# Patient Record
Sex: Female | Born: 1970 | Race: White | Hispanic: No | Marital: Single | Smoking: Current every day smoker
Health system: Southern US, Community
[De-identification: ages and names within clinical notes are randomized; demographics above are authoritative.]

## PROBLEM LIST (undated history)

## (undated) DIAGNOSIS — F319 Bipolar disorder, unspecified: Secondary | ICD-10-CM

## (undated) DIAGNOSIS — F419 Anxiety disorder, unspecified: Secondary | ICD-10-CM

---

## 2014-11-27 ENCOUNTER — Emergency Department: Payer: Self-pay | Admitting: Emergency Medicine

## 2014-12-15 ENCOUNTER — Emergency Department: Payer: Self-pay | Admitting: Emergency Medicine

## 2014-12-15 LAB — COMPREHENSIVE METABOLIC PANEL
ALBUMIN: 3.3 g/dL — AB (ref 3.4–5.0)
ALT: 16 U/L
AST: 34 U/L (ref 15–37)
Alkaline Phosphatase: 47 U/L
Anion Gap: 15 (ref 7–16)
BUN: 6 mg/dL — ABNORMAL LOW (ref 7–18)
Bilirubin,Total: 0.4 mg/dL (ref 0.2–1.0)
CHLORIDE: 105 mmol/L (ref 98–107)
Calcium, Total: 7.1 mg/dL — ABNORMAL LOW (ref 8.5–10.1)
Co2: 17 mmol/L — ABNORMAL LOW (ref 21–32)
Creatinine: 0.43 mg/dL — ABNORMAL LOW (ref 0.60–1.30)
EGFR (Non-African Amer.): >60
Glucose: 311 mg/dL — ABNORMAL HIGH (ref 65–99)
Osmolality: 283 (ref 275–301)
Potassium: 4.3 mmol/L (ref 3.5–5.1)
Sodium: 137 mmol/L (ref 136–145)
Total Protein: 6.5 g/dL (ref 6.4–8.2)

## 2014-12-15 LAB — CBC
HCT: 43 % (ref 35.0–47.0)
HGB: 14.1 g/dL (ref 12.0–16.0)
MCH: 31.6 pg (ref 26.0–34.0)
MCHC: 32.8 g/dL (ref 32.0–36.0)
MCV: 96 fL (ref 80–100)
Platelet: 236 10*3/uL (ref 150–440)
RBC: 4.46 10*6/uL (ref 3.80–5.20)
RDW: 15 % — ABNORMAL HIGH (ref 11.5–14.5)
WBC: 6.2 10*3/uL (ref 3.6–11.0)

## 2014-12-15 LAB — ETHANOL: ETHANOL LVL: 356 mg/dL — AB

## 2014-12-15 LAB — URINALYSIS, COMPLETE
Bacteria: NONE SEEN
Bilirubin,UR: NEGATIVE
GLUCOSE, UR: NEGATIVE mg/dL (ref 0–75)
LEUKOCYTE ESTERASE: NEGATIVE
Nitrite: NEGATIVE
PH: 7 (ref 4.5–8.0)
Protein: NEGATIVE
RBC,UR: 1 /HPF (ref 0–5)
SQUAMOUS EPITHELIAL: NONE SEEN
Specific Gravity: 1.006 (ref 1.003–1.030)
WBC UR: <1 /HPF (ref 0–5)

## 2014-12-15 LAB — DRUG SCREEN, URINE
AMPHETAMINES, UR SCREEN: NEGATIVE (ref ?–1000)
BENZODIAZEPINE, UR SCRN: POSITIVE (ref ?–200)
Barbiturates, Ur Screen: NEGATIVE (ref ?–200)
CANNABINOID 50 NG, UR ~~LOC~~: NEGATIVE (ref ?–50)
Cocaine Metabolite,Ur ~~LOC~~: NEGATIVE (ref ?–300)
MDMA (ECSTASY) UR SCREEN: NEGATIVE (ref ?–500)
Methadone, Ur Screen: NEGATIVE (ref ?–300)
Opiate, Ur Screen: NEGATIVE (ref ?–300)
Phencyclidine (PCP) Ur S: NEGATIVE (ref ?–25)
Tricyclic, Ur Screen: NEGATIVE (ref ?–1000)

## 2014-12-15 LAB — PREGNANCY, URINE: PREGNANCY TEST, URINE: NEGATIVE m[IU]/mL

## 2014-12-16 ENCOUNTER — Inpatient Hospital Stay: Payer: Self-pay | Admitting: Psychiatry

## 2014-12-16 LAB — ETHANOL

## 2014-12-19 LAB — FOLATE: Folic Acid: 75.9 ng/mL — ABNORMAL HIGH (ref 3.1–17.5)

## 2014-12-20 LAB — VALPROIC ACID LEVEL: Valproic Acid: 82 ug/mL

## 2015-01-06 ENCOUNTER — Emergency Department: Payer: Self-pay | Admitting: Student

## 2015-01-06 ENCOUNTER — Emergency Department: Payer: Self-pay | Admitting: Emergency Medicine

## 2015-01-06 LAB — URINALYSIS, COMPLETE
Bacteria: NONE SEEN
Bilirubin,UR: NEGATIVE
Glucose,UR: NEGATIVE mg/dL (ref 0–75)
KETONE: NEGATIVE
Leukocyte Esterase: NEGATIVE
Nitrite: NEGATIVE
PH: 7 (ref 4.5–8.0)
Protein: NEGATIVE
Specific Gravity: 1.003 (ref 1.003–1.030)
WBC UR: <1 /HPF (ref 0–5)

## 2015-01-06 LAB — COMPREHENSIVE METABOLIC PANEL
ALBUMIN: 3.8 g/dL (ref 3.4–5.0)
ALT: 78 U/L — AB
Alkaline Phosphatase: 65 U/L
Anion Gap: 10 (ref 7–16)
BILIRUBIN TOTAL: 0.2 mg/dL (ref 0.2–1.0)
BUN: 8 mg/dL (ref 7–18)
CHLORIDE: 99 mmol/L (ref 98–107)
CO2: 23 mmol/L (ref 21–32)
CREATININE: 0.63 mg/dL (ref 0.60–1.30)
Calcium, Total: 8.8 mg/dL (ref 8.5–10.1)
EGFR (Non-African Amer.): >60
Glucose: 80 mg/dL (ref 65–99)
Osmolality: 262 (ref 275–301)
POTASSIUM: 4.1 mmol/L (ref 3.5–5.1)
SGOT(AST): 68 U/L — ABNORMAL HIGH (ref 15–37)
SODIUM: 132 mmol/L — AB (ref 136–145)
TOTAL PROTEIN: 6.6 g/dL (ref 6.4–8.2)

## 2015-01-06 LAB — CBC
HCT: 37.9 % (ref 35.0–47.0)
HGB: 12.7 g/dL (ref 12.0–16.0)
MCH: 31.7 pg (ref 26.0–34.0)
MCHC: 33.6 g/dL (ref 32.0–36.0)
MCV: 94 fL (ref 80–100)
Platelet: 242 10*3/uL (ref 150–440)
RBC: 4.02 10*6/uL (ref 3.80–5.20)
RDW: 15.3 % — ABNORMAL HIGH (ref 11.5–14.5)
WBC: 6.9 10*3/uL (ref 3.6–11.0)

## 2015-01-06 LAB — DRUG SCREEN, URINE
AMPHETAMINES, UR SCREEN: NEGATIVE (ref ?–1000)
BENZODIAZEPINE, UR SCRN: NEGATIVE (ref ?–200)
Barbiturates, Ur Screen: POSITIVE (ref ?–200)
Cannabinoid 50 Ng, Ur ~~LOC~~: NEGATIVE (ref ?–50)
Cocaine Metabolite,Ur ~~LOC~~: NEGATIVE (ref ?–300)
MDMA (ECSTASY) UR SCREEN: NEGATIVE (ref ?–500)
Methadone, Ur Screen: NEGATIVE (ref ?–300)
Opiate, Ur Screen: NEGATIVE (ref ?–300)
PHENCYCLIDINE (PCP) UR S: NEGATIVE (ref ?–25)
Tricyclic, Ur Screen: NEGATIVE (ref ?–1000)

## 2015-01-06 LAB — SALICYLATE LEVEL: SALICYLATES, SERUM: 2.2 mg/dL

## 2015-01-06 LAB — ETHANOL
Ethanol: 35 mg/dL
Ethanol: 257 mg/dL

## 2015-01-06 LAB — ACETAMINOPHEN LEVEL: ACETAMINOPHEN: 3 ug/mL — AB

## 2015-01-07 ENCOUNTER — Encounter (HOSPITAL_COMMUNITY): Payer: Self-pay | Admitting: Emergency Medicine

## 2015-01-07 ENCOUNTER — Ambulatory Visit (HOSPITAL_COMMUNITY)
Admission: AD | Admit: 2015-01-07 | Discharge: 2015-01-07 | Disposition: A | Discharge status: Home / Self Care | Payer: 59 | Source: Intra-hospital | Attending: Psychiatry | Admitting: Psychiatry

## 2015-01-07 ENCOUNTER — Encounter (HOSPITAL_COMMUNITY): Payer: Self-pay | Admitting: *Deleted

## 2015-01-07 ENCOUNTER — Emergency Department (HOSPITAL_COMMUNITY)
Admission: EM | Admit: 2015-01-07 | Discharge: 2015-01-07 | Disposition: A | Discharge status: Other Acute Inpatient Hospital | Payer: 59 | Attending: Emergency Medicine | Admitting: Emergency Medicine

## 2015-01-07 ENCOUNTER — Inpatient Hospital Stay (HOSPITAL_COMMUNITY)
Admission: EM | Admit: 2015-01-07 | Discharge: 2015-01-12 | DRG: 897 | Disposition: A | Discharge status: Home / Self Care | Payer: 59 | Source: Intra-hospital | Attending: Psychiatry | Admitting: Psychiatry

## 2015-01-07 DIAGNOSIS — F1010 Alcohol abuse, uncomplicated: Secondary | ICD-10-CM | POA: Diagnosis not present

## 2015-01-07 DIAGNOSIS — F1310 Sedative, hypnotic or anxiolytic abuse, uncomplicated: Secondary | ICD-10-CM | POA: Diagnosis not present

## 2015-01-07 DIAGNOSIS — F319 Bipolar disorder, unspecified: Secondary | ICD-10-CM | POA: Insufficient documentation

## 2015-01-07 DIAGNOSIS — F410 Panic disorder [episodic paroxysmal anxiety] without agoraphobia: Secondary | ICD-10-CM | POA: Diagnosis present

## 2015-01-07 DIAGNOSIS — G4700 Insomnia, unspecified: Secondary | ICD-10-CM | POA: Diagnosis present

## 2015-01-07 DIAGNOSIS — Z720 Tobacco use: Secondary | ICD-10-CM | POA: Diagnosis not present

## 2015-01-07 DIAGNOSIS — F411 Generalized anxiety disorder: Secondary | ICD-10-CM | POA: Diagnosis present

## 2015-01-07 DIAGNOSIS — Z79899 Other long term (current) drug therapy: Secondary | ICD-10-CM | POA: Insufficient documentation

## 2015-01-07 DIAGNOSIS — F3130 Bipolar disorder, current episode depressed, mild or moderate severity, unspecified: Secondary | ICD-10-CM | POA: Diagnosis present

## 2015-01-07 DIAGNOSIS — Z23 Encounter for immunization: Secondary | ICD-10-CM | POA: Diagnosis not present

## 2015-01-07 DIAGNOSIS — F17210 Nicotine dependence, cigarettes, uncomplicated: Secondary | ICD-10-CM | POA: Diagnosis present

## 2015-01-07 DIAGNOSIS — Y908 Blood alcohol level of 240 mg/100 ml or more: Secondary | ICD-10-CM | POA: Diagnosis present

## 2015-01-07 DIAGNOSIS — F1020 Alcohol dependence, uncomplicated: Secondary | ICD-10-CM | POA: Diagnosis present

## 2015-01-07 DIAGNOSIS — F419 Anxiety disorder, unspecified: Secondary | ICD-10-CM | POA: Insufficient documentation

## 2015-01-07 DIAGNOSIS — F10929 Alcohol use, unspecified with intoxication, unspecified: Secondary | ICD-10-CM | POA: Diagnosis present

## 2015-01-07 HISTORY — DX: Bipolar disorder, unspecified: F31.9

## 2015-01-07 HISTORY — DX: Anxiety disorder, unspecified: F41.9

## 2015-01-07 LAB — COMPREHENSIVE METABOLIC PANEL
ALBUMIN: 4.1 g/dL (ref 3.5–5.2)
ALK PHOS: 61 U/L (ref 39–117)
ALT: 64 U/L — AB (ref 0–35)
AST: 55 U/L — AB (ref 0–37)
Anion gap: 13 (ref 5–15)
BILIRUBIN TOTAL: 0.3 mg/dL (ref 0.3–1.2)
BUN: 6 mg/dL (ref 6–23)
CALCIUM: 8.5 mg/dL (ref 8.4–10.5)
CO2: 24 mmol/L (ref 19–32)
Chloride: 98 mmol/L (ref 96–112)
Creatinine, Ser: 0.5 mg/dL (ref 0.50–1.10)
GFR calc Af Amer: >90 mL/min (ref >90)
GFR calc non Af Amer: >90 mL/min (ref >90)
Glucose, Bld: 154 mg/dL — ABNORMAL HIGH (ref 70–99)
Potassium: 4.1 mmol/L (ref 3.5–5.1)
SODIUM: 135 mmol/L (ref 135–145)
Total Protein: 6.8 g/dL (ref 6.0–8.3)

## 2015-01-07 LAB — CBC WITH DIFFERENTIAL/PLATELET
BASOS PCT: 1 % (ref 0–1)
Basophils Absolute: 0.1 10*3/uL (ref 0.0–0.1)
Eosinophils Absolute: 0 10*3/uL (ref 0.0–0.7)
Eosinophils Relative: 0 % (ref 0–5)
HEMATOCRIT: 40 % (ref 36.0–46.0)
HEMOGLOBIN: 13.2 g/dL (ref 12.0–15.0)
Lymphocytes Relative: 18 % (ref 12–46)
Lymphs Abs: 1.5 10*3/uL (ref 0.7–4.0)
MCH: 31.1 pg (ref 26.0–34.0)
MCHC: 33 g/dL (ref 30.0–36.0)
MCV: 94.3 fL (ref 78.0–100.0)
MONO ABS: 0.8 10*3/uL (ref 0.1–1.0)
Monocytes Relative: 9 % (ref 3–12)
NEUTROS ABS: 6.3 10*3/uL (ref 1.7–7.7)
Neutrophils Relative %: 72 % (ref 43–77)
Platelets: 338 10*3/uL (ref 150–400)
RBC: 4.24 MIL/uL (ref 3.87–5.11)
RDW: 15.7 % — ABNORMAL HIGH (ref 11.5–15.5)
WBC: 8.8 10*3/uL (ref 4.0–10.5)

## 2015-01-07 LAB — RAPID URINE DRUG SCREEN, HOSP PERFORMED
Amphetamines: NOT DETECTED
BARBITURATES: POSITIVE — AB
Benzodiazepines: POSITIVE — AB
COCAINE: NOT DETECTED
OPIATES: NOT DETECTED
Tetrahydrocannabinol: NOT DETECTED

## 2015-01-07 LAB — VALPROIC ACID LEVEL: Valproic Acid Lvl: 35.2 ug/mL — ABNORMAL LOW (ref 50.0–100.0)

## 2015-01-07 LAB — ETHANOL: Alcohol, Ethyl (B): 278 mg/dL — ABNORMAL HIGH (ref 0–9)

## 2015-01-07 MED ORDER — ONDANSETRON 4 MG PO TBDP: 4.0000 mg | ORAL_TABLET | Freq: Four times a day (QID) | ORAL | Status: DC | PRN

## 2015-01-07 MED ORDER — CHLORDIAZEPOXIDE HCL 25 MG PO CAPS
25.0000 mg | ORAL_CAPSULE | Freq: Four times a day (QID) | ORAL | Status: DC | PRN
  Administered 2015-01-07 – 2015-01-09 (×5): 25 mg via ORAL
  Filled 2015-01-07 (×4): qty 1

## 2015-01-07 MED ORDER — THIAMINE HCL 100 MG/ML IJ SOLN: 100.0000 mg | Freq: Once | INTRAMUSCULAR | Status: DC

## 2015-01-07 MED ORDER — ONDANSETRON 4 MG PO TBDP: 4.0000 mg | ORAL_TABLET | Freq: Four times a day (QID) | ORAL | Status: AC | PRN

## 2015-01-07 MED ORDER — ADULT MULTIVITAMIN W/MINERALS CH
1.0000 | ORAL_TABLET | Freq: Every day | ORAL | Status: DC
  Administered 2015-01-08 – 2015-01-12 (×5): 1 via ORAL
  Filled 2015-01-07 (×7): qty 1

## 2015-01-07 MED ORDER — LOPERAMIDE HCL 2 MG PO CAPS: 2.0000 mg | ORAL_CAPSULE | ORAL | Status: AC | PRN

## 2015-01-07 MED ORDER — ADULT MULTIVITAMIN W/MINERALS CH
1.0000 | ORAL_TABLET | Freq: Every day | ORAL | Status: DC
  Administered 2015-01-07: 1 via ORAL
  Filled 2015-01-07: qty 1

## 2015-01-07 MED ORDER — HALOPERIDOL 5 MG PO TABS
5.0000 mg | ORAL_TABLET | Freq: Four times a day (QID) | ORAL | Status: DC | PRN
  Administered 2015-01-08 (×2): 5 mg via ORAL
  Filled 2015-01-07 (×2): qty 1

## 2015-01-07 MED ORDER — LORAZEPAM 1 MG PO TABS
1.0000 mg | ORAL_TABLET | Freq: Once | ORAL | Status: AC
  Administered 2015-01-07: 1 mg via ORAL
  Filled 2015-01-07: qty 1

## 2015-01-07 MED ORDER — HYDROXYZINE HCL 25 MG PO TABS
25.0000 mg | ORAL_TABLET | Freq: Four times a day (QID) | ORAL | Status: DC | PRN
  Administered 2015-01-08: 25 mg via ORAL
  Filled 2015-01-07: qty 1

## 2015-01-07 MED ORDER — CHLORDIAZEPOXIDE HCL 25 MG PO CAPS: 25.0000 mg | ORAL_CAPSULE | Freq: Four times a day (QID) | ORAL | Status: DC | PRN

## 2015-01-07 MED ORDER — ACETAMINOPHEN 325 MG PO TABS: 650.0000 mg | ORAL_TABLET | Freq: Four times a day (QID) | ORAL | Status: DC | PRN

## 2015-01-07 MED ORDER — HYDROXYZINE HCL 25 MG PO TABS: 25.0000 mg | ORAL_TABLET | Freq: Four times a day (QID) | ORAL | Status: DC | PRN

## 2015-01-07 MED ORDER — MAGNESIUM HYDROXIDE 400 MG/5ML PO SUSP: 30.0000 mL | Freq: Every day | ORAL | Status: DC | PRN

## 2015-01-07 MED ORDER — VITAMIN B-1 100 MG PO TABS
100.0000 mg | ORAL_TABLET | Freq: Every day | ORAL | Status: DC
  Administered 2015-01-07: 100 mg via ORAL
  Filled 2015-01-07: qty 1

## 2015-01-07 MED ORDER — PNEUMOCOCCAL VAC POLYVALENT 25 MCG/0.5ML IJ INJ: 0.5000 mL | INJECTION | INTRAMUSCULAR | Status: DC

## 2015-01-07 MED ORDER — LOPERAMIDE HCL 2 MG PO CAPS
2.0000 mg | ORAL_CAPSULE | ORAL | Status: DC | PRN
Start: 2015-01-07 — End: 2015-01-07

## 2015-01-07 MED ORDER — INFLUENZA VAC SPLIT QUAD 0.5 ML IM SUSY
0.5000 mL | PREFILLED_SYRINGE | INTRAMUSCULAR | Status: DC
  Filled 2015-01-07: qty 0.5

## 2015-01-07 MED ORDER — ALUM & MAG HYDROXIDE-SIMETH 200-200-20 MG/5ML PO SUSP: 30.0000 mL | ORAL | Status: DC | PRN

## 2015-01-07 MED ORDER — TRAZODONE HCL 50 MG PO TABS
50.0000 mg | ORAL_TABLET | Freq: Every evening | ORAL | Status: DC | PRN
  Administered 2015-01-07 – 2015-01-08 (×2): 50 mg via ORAL
  Filled 2015-01-07: qty 1

## 2015-01-07 NOTE — Progress Notes (Signed)
Pt admitted to [facility] Adult unit with anxiety, depression and alcohol dependence. Pt presents anxious, irritable, depressed but cooperative. 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 Explanations and directions given. Pt is AOx4, admits to not always remembering details of events and is a poor historian. Denies SI/HI at present to Probation officer and admits to earlier statement in ED about wanting to kill herself. Verbally contracts for safety. -A/[employer]. Pt reports drinking a pint or more daily for about 12 years on/off. Recent detox in rehab facility and relapsed this prior weekend. Pt admits to depression related to multiple stressors (losing job, break up with  Boyfriend, being fired from job and death of Neurosurgeon).  Emotional support and encouragement given. Will monitor closely and evaluate for stabilization.

## 2015-01-07 NOTE — ED Notes (Signed)
Per pt states increased ETOH, anxiety-was at [facility] for eval, sent here for medical clearance and can return onced cleared

## 2015-01-07 NOTE — Progress Notes (Signed)
Pt AUDIT score 30. Alcohol Education Use sheet reviewed with pt. Physician notified of AUDIT score.

## 2015-01-07 NOTE — Tx Team (Signed)
Initial Interdisciplinary Treatment Plan   PATIENT STRESSORS: Loss of job Substance abuse Traumatic event death of cat Break up with boyfriend   PATIENT STRENGTHS: Ability for insight Average or above average intelligence Capable of independent living Communication skills Physical Health Supportive family/friends   PROBLEM LIST: Problem List/Patient Goals Date to be addressed Date deferred Reason deferred Estimated date of resolution  Alcohol Dependence "to stop drinking" 01/07/15   At D/C  Anxiety  01/07/15   At D/c  Depression 01/07/15   At D/c                                       DISCHARGE CRITERIA:  Adequate post-discharge living arrangements Improved stabilization in mood, thinking, and/or behavior Motivation to continue treatment in a less acute level of care Need for constant or close observation no longer present Verbal commitment to aftercare and medication compliance Withdrawal symptoms are absent or subacute and managed without 24-hour nursing intervention  PRELIMINARY DISCHARGE PLAN: Attend PHP/IOP Outpatient therapy Return to previous living arrangement  PATIENT/FAMIILY INVOLVEMENT: This treatment plan has been presented to and reviewed with the patient, Lottie Sigman.  The patient and family have been given the opportunity to ask questions and make suggestions.  Apolinar Junes 01/07/2015, 10:21 PM
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
## 2015-01-07 NOTE — ED Provider Notes (Signed)
CSN: [id number]     Arrival date & time 01/07/15  1647 History   First MD Initiated Contact with Patient 01/07/15 1858     Chief Complaint  Patient presents with  . Anxiety     (Consider location/radiation/quality/duration/timing/severity/associated sxs/prior Treatment) HPI  Pt with hx of bipolar, alcohol abuse presents with request for medical clearance from [facility].  She was placed under IVC there and will be admitted once medically cleared here.  Pt states she had her last drink this morning.  She states she is very anxious and is requesting ativan.  Pt is not very forthcoming with her history.  There are no other associated systemic symptoms, there are no other alleviating or modifying factors.  Symptoms are constant and worsening.    Past Medical History  Diagnosis Date  . Anxiety   . Bipolar 1 disorder    History reviewed. No pertinent past surgical history. No family history on file. History  Substance Use Topics  . Smoking status: Current Every Day Smoker    Types: Cigarettes  . Smokeless tobacco: Not on file  . Alcohol Use: Yes   OB History    No data available     Review of Systems  ROS reviewed and all otherwise negative except for mentioned in HPI    Allergies  Review of patient's allergies indicates no known allergies.  Home Medications   Prior to Admission medications   Medication Sig Start Date End Date Taking? Authorizing Provider  Asenapine Maleate 10 MG SUBL Place 10 mg under the tongue daily.   Yes Historical Provider, MD  busPIRone (BUSPAR) 5 MG tablet Take 5 mg by mouth 3 (three) times daily.   Yes Historical Provider, MD  divalproex (DEPAKOTE) 500 MG DR tablet Take 500 mg by mouth 2 (two) times daily.   Yes Historical Provider, MD  gabapentin (NEURONTIN) 400 MG capsule Take 400 mg by mouth 3 (three) times daily.   Yes Historical Provider, MD  hydrOXYzine (ATARAX/VISTARIL) 25 MG tablet Take 25 mg by mouth 3 (three) times daily as needed for anxiety  (anxiety).   Yes Historical Provider, MD   BP 107/68 mmHg  Pulse 119  Temp(Src) 97.6 F (36.4 C) (Oral)  Resp 18  SpO2 98%  LMP  (LMP Unknown)  Vitals reviewed Physical Exam  Physical Examination: General appearance - alert, well appearing, and in no distress Mental status - alert, oriented to person, place, and time Eyes - no conjunctival injection, no scleral icterus Mouth - mucous membranes moist, pharynx normal without lesions Chest - clear to auscultation, no wheezes, rales or rhonchi, symmetric air entry Heart - normal rate, regular rhythm, normal S1, S2, no murmurs, rubs, clicks or gallops Abdomen - soft, nontender, nondistended, no masses or organomegaly Extremities - peripheral pulses normal, no pedal edema, no clubbing or cyanosis Skin - normal coloration and turgor, no rashes Psych- anxious, labile  ED Course  Procedures (including critical care time)  7:24 PM d/w [place], TTS, she has talked with [facility] and states they are OK with patient being transferred back to [facility] as long as the depakote level is pending- it is drawn and pending.   Labs Review Labs Reviewed  VALPROIC ACID LEVEL - Abnormal; Notable for the following:    Valproic Acid Lvl 35.2 (*)    All other components within normal limits  CBC WITH DIFFERENTIAL/PLATELET - Abnormal; Notable for the following:    RDW 15.7 (*)    All other components within normal limits  COMPREHENSIVE  METABOLIC PANEL - Abnormal; Notable for the following:    Glucose, Bld 154 (*)    AST 55 (*)    ALT 64 (*)    All other components within normal limits  URINE RAPID DRUG SCREEN (HOSP PERFORMED) - Abnormal; Notable for the following:    Benzodiazepines POSITIVE (*)    Barbiturates POSITIVE (*)    All other components within normal limits  ETHANOL - Abnormal; Notable for the following:    Alcohol, Ethyl (B) 278 (*)    All other components within normal limits    Imaging Review No results found.   EKG Interpretation None       MDM   Final diagnoses:  Alcohol abuse  Anxiety    Pt presenting for medical clearance from [facility]- she was placed under IVC there- hx of bipolar and alcohol abuse.  Pt is medically cleared, depakote level is pending- [facility] has accepted patient back there.      Threasa Beards, MD 01/08/15 Laureen Abrahams

## 2015-01-07 NOTE — ED Notes (Signed)
Pt has a bed at [facility], [address]

## 2015-01-07 NOTE — ED Notes (Signed)
Per EMS pt IVCed here from behavioral health for lab work and head x-ray after falling in kitchen, hitting head, and feeling anxious.

## 2015-01-08 ENCOUNTER — Encounter (HOSPITAL_COMMUNITY): Payer: Self-pay | Admitting: Psychiatry

## 2015-01-08 DIAGNOSIS — F3130 Bipolar disorder, current episode depressed, mild or moderate severity, unspecified: Secondary | ICD-10-CM

## 2015-01-08 DIAGNOSIS — F1020 Alcohol dependence, uncomplicated: Principal | ICD-10-CM

## 2015-01-08 LAB — LIPID PANEL
CHOL/HDL RATIO: 1.9 ratio
Cholesterol: 158 mg/dL (ref 0–200)
HDL: 85 mg/dL (ref >39)
LDL Cholesterol: 56 mg/dL (ref 0–99)
Triglycerides: 83 mg/dL (ref <150)
VLDL: 17 mg/dL (ref 0–40)

## 2015-01-08 LAB — TSH: TSH: 2.858 u[IU]/mL (ref 0.350–4.500)

## 2015-01-08 MED ORDER — ASENAPINE MALEATE 10 MG SL SUBL
10.0000 mg | SUBLINGUAL_TABLET | Freq: Every day | SUBLINGUAL | Status: DC
  Filled 2015-01-08 (×3): qty 1

## 2015-01-08 MED ORDER — ASENAPINE MALEATE 10 MG SL SUBL
10.0000 mg | SUBLINGUAL_TABLET | Freq: Every day | SUBLINGUAL | Status: DC
  Administered 2015-01-08: 10 mg via SUBLINGUAL
  Filled 2015-01-08 (×2): qty 1

## 2015-01-08 MED ORDER — DIVALPROEX SODIUM 500 MG PO DR TAB
500.0000 mg | DELAYED_RELEASE_TABLET | Freq: Two times a day (BID) | ORAL | Status: DC
  Administered 2015-01-08 – 2015-01-12 (×9): 500 mg via ORAL
  Filled 2015-01-08 (×2): qty 1
  Filled 2015-01-08: qty 8
  Filled 2015-01-08: qty 1
  Filled 2015-01-08: qty 8
  Filled 2015-01-08 (×7): qty 1
  Filled 2015-01-08: qty 8
  Filled 2015-01-08 (×2): qty 1
  Filled 2015-01-08: qty 8

## 2015-01-08 MED ORDER — CHLORDIAZEPOXIDE HCL 25 MG PO CAPS
25.0000 mg | ORAL_CAPSULE | Freq: Every evening | ORAL | Status: DC | PRN
  Administered 2015-01-08 – 2015-01-11 (×4): 25 mg via ORAL
  Filled 2015-01-08 (×5): qty 1

## 2015-01-08 MED ORDER — HYDROXYZINE HCL 25 MG PO TABS
25.0000 mg | ORAL_TABLET | Freq: Three times a day (TID) | ORAL | Status: DC | PRN
  Administered 2015-01-08 – 2015-01-11 (×3): 25 mg via ORAL
  Filled 2015-01-08 (×2): qty 1
  Filled 2015-01-08: qty 10
  Filled 2015-01-08: qty 1

## 2015-01-08 MED ORDER — DIVALPROEX SODIUM 500 MG PO DR TAB: 500.0000 mg | DELAYED_RELEASE_TABLET | Freq: Two times a day (BID) | ORAL | Status: DC

## 2015-01-08 MED ORDER — GABAPENTIN 400 MG PO CAPS
400.0000 mg | ORAL_CAPSULE | Freq: Three times a day (TID) | ORAL | Status: DC
  Administered 2015-01-08 – 2015-01-12 (×13): 400 mg via ORAL
  Filled 2015-01-08 (×19): qty 1

## 2015-01-08 MED ORDER — TRAZODONE HCL 100 MG PO TABS
100.0000 mg | ORAL_TABLET | Freq: Every evening | ORAL | Status: DC | PRN
  Administered 2015-01-08: 100 mg via ORAL
  Filled 2015-01-08: qty 1

## 2015-01-08 MED ORDER — BUSPIRONE HCL 5 MG PO TABS
5.0000 mg | ORAL_TABLET | Freq: Three times a day (TID) | ORAL | Status: DC
  Administered 2015-01-08 – 2015-01-12 (×13): 5 mg via ORAL
  Filled 2015-01-08: qty 1
  Filled 2015-01-08: qty 12
  Filled 2015-01-08 (×2): qty 1
  Filled 2015-01-08 (×2): qty 12
  Filled 2015-01-08 (×4): qty 1
  Filled 2015-01-08: qty 12
  Filled 2015-01-08: qty 1
  Filled 2015-01-08: qty 12
  Filled 2015-01-08 (×4): qty 1
  Filled 2015-01-08: qty 12
  Filled 2015-01-08 (×6): qty 1

## 2015-01-08 NOTE — BHH Suicide Risk Assessment (Signed)
[facility] Admission Suicide Risk Assessment   Nursing information obtained from:  Patient Demographic factors:  Caucasian, Unemployed Current Mental Status:  NA Loss Factors:  Decrease in vocational status, Loss of significant relationship Historical Factors:  Family history of mental illness or substance abuse Risk Reduction Factors:  Religious beliefs about death, Living with another person, especially a relative, Positive social support Total Time spent with patient: 45 minutes Principal Problem: Alcohol use disorder, severe, dependence Diagnosis:   Patient Active Problem List   Diagnosis Date Noted  . Bipolar I disorder, most recent episode depressed [F31.30] 01/08/2015  . Alcohol use disorder, severe, dependence [F10.20] 01/07/2015     Continued Clinical Symptoms:  Alcohol Use Disorder Identification Test Final Score (AUDIT): 30 The "Alcohol Use Disorders Identification Test", Guidelines for Use in Primary Care, Second Edition.  World Pharmacologist [facility]). Score between 0-7:  no or low risk or alcohol related problems. Score between 8-15:  moderate risk of alcohol related problems. Score between 16-19:  high risk of alcohol related problems. Score 20 or above:  warrants further diagnostic evaluation for alcohol dependence and treatment.   CLINICAL FACTORS:   Bipolar Disorder:   Depressive phase Alcohol/Substance Abuse/Dependencies   Musculoskeletal: Strength & Muscle Tone: within normal limits Gait & Station: normal Patient leans: N/A  Psychiatric Specialty Exam: Physical Exam  ROS  Blood pressure 99/59, pulse 103, temperature 98.1 F (36.7 C), temperature source Oral, resp. rate 18, height 5\' 8"  (1.727 m), weight 61.236 kg (135 lb).Body mass index is 20.53 kg/(m^2).  General Appearance: Disheveled  Eye Sport and exercise psychologist::  Fair  Speech:  Clear and Coherent, Pressured and interrupted, at times childish sounding  Volume:  fluctuates  Mood:  Anxious, Depressed, Dysphoric and  Irritable  Affect:  Labile and Tearful  Thought Process:  Coherent and Goal Directed  Orientation:  Full (Time, Place, and Person)  Thought Content:  symptoms events worries concerns ruminations  Suicidal Thoughts:  No  Homicidal Thoughts:  No  Memory:  Immediate;   Fair Recent;   Fair Remote;   Fair  Judgement:  Fair  Insight:  Present and Shallow  Psychomotor Activity:  Restlessness and Tremor  Concentration:  Poor  Recall:  [employer] of Knowledge:Poor  Language: Fair  Akathisia:  No  Handed:  Right  AIMS (if indicated):     Assets:  Desire for Improvement  Sleep:  Number of Hours: 4.25  Cognition: WNL  ADL's:  Intact     COGNITIVE FEATURES THAT CONTRIBUTE TO RISK:  Closed-mindedness, Polarized thinking and Thought constriction (tunnel vision)    SUICIDE RISK:   Moderate:   PLAN OF CARE: Supportive approach/coping skills/relapse prevention                               Detox as needed                               Reassess and address her Bipolar Depression     Medical Decision Making:  Review of Psycho-Social Stressors (1), Established Problem, Worsening (2), Review of Medication Regimen & Side Effects (2) and Review of New Medication or Change in Dosage (2)  I certify that inpatient services furnished can reasonably be expected to improve the patient's condition.   Solen A 01/08/2015, 5:20 PM

## 2015-01-08 NOTE — BHH Group Notes (Signed)
[place] LCSW Group Therapy  01/08/2015 2:29 PM  Type of Therapy:  Group Therapy  Participation Level:  Minimal  Participation Quality:  Attentive  Affect:  Depressed and Labile  Cognitive:  Lacking  Insight:  Limited  Engagement in Therapy:  Improving  Modes of Intervention:  Confrontation, Discussion, Education, Exploration, Problem-solving, Rapport Building, Socialization and Support  Summary of Progress/Problems: Emotion Regulation: This group focused on both positive and negative emotion identification and allowed group members to process ways to identify feelings, regulate negative emotions, and find healthy ways to manage internal/external emotions. Group members were asked to reflect on a time when their reaction to an emotion led to a negative outcome and explored how alternative responses using emotion regulation would have benefited them. Group members were also asked to discuss a time when emotion regulation was utilized when a negative emotion was experienced. Jessica [name] was attentive during group but minimally participated in group discussion. When participating, she was able to remain on topic. Jessica [name] shared that she struggles with anxiety and often gets anxious due to not being able to find work and due to boredom/lack of her sense of purpose. She shows some progress in the group setting and improving insight AEB her ability to process how her past success at sobriety (15 months) can help her achieve the goal of sobriety again. "I went to a lot of AA meetings and had a lot of support from my friends and family."   [name], Jessica Amel  01/08/2015, 2:29 PM

## 2015-01-08 NOTE — Progress Notes (Signed)
D:  Patient has been highly anxious and tearful throughout the evening.  She reports that she is having some withdrawal symptoms of slight tremors and anxiety.  She did attend AA/NA group and talked to multiple family members on the phone.  She denies any suicidal or homicidal ideation.  After being given prn Trazodone, and librium in addition to nighttime meds she was resting comfortably in her room A:  Medications administered as ordered.  Safety checks q 15 minutes.  Emotional support provided. R:  Safety maintained on unit.

## 2015-01-08 NOTE — BHH Counselor (Addendum)
Adult Comprehensive Assessment  Patient ID: Jessica [name], female   DOB: December 02, 1971, 44 y.o.   MRN: [id number]  Information Source: Information source: Patient  Current Stressors:  Physical health (include injuries & life threatening diseases): hand twitching for past three weeks.  Bereavement / Loss: none reported.   Living/Environment/Situation:  Living Arrangements: Parent Living conditions (as described by patient or guardian): Lives in house. comfortable; loving; safe How long has patient lived in current situation?: 3 months =prior to that, living in [place], [place] for 18 years.  What is atmosphere in current home: Comfortable, Loving  Family History:  Marital status: Single Does patient have children?: No  Childhood History:  By whom was/is the patient raised?: Both parents Additional childhood history information: Both parents raised me until 44; parents divorced at 42. lived with mom for 2 years, then lived with dad until 48. I have a step mom  and get along well  with both stepparents  Description of patient's relationship with caregiver when they were a child: close to both parents and stepparents Patient's description of current relationship with people who raised him/her: good relationship with mom and dad and stepparents. mom lives in [place]  Does patient have siblings?: Yes Number of Siblings: 1 Description of patient's current relationship with siblings: Occupational psychologist. He lives in [place]. we are close.  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  Currently a student?: No Learning disability?: No ("I did really well in  school.")  Employment/Work Situation:   Employment situation: Unemployed (I've been unemployed for three months since moving back home. 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  Has patient ever been in the [employer]?: No Has patient ever served in combat?: No  Financial Resources:   Surveyor, minerals, Support from parents / caregiver Does patient have a Programmer, applications or guardian?: No  Alcohol/Substance Abuse:   What has been your use of drugs/alcohol within the last 12 months?: Relapsed three days ago-I drank vodka and wine until I black out-mostly at night. Prior to relapse, I was in treatment at [facility] for 2 weeks and sober for 3 weeks. 15 months sobriety in past 6 years of adtive addiction. "meetings and IOP helped alot."  If attempted suicide, did drugs/alcohol play a role in this?: No (no SI past or present) Alcohol/Substance Abuse Treatment Hx: Past Tx, Inpatient If yes, describe treatment: [facility] for treatment. [facility]-detox only. [facility]det[facility]. IOP in [place]. Therapy in [place], [place]. I see a psychiatrist in [place], [place]. I go [place] meetings. I have a sponsor in [place].  Has alcohol/substance abuse ever caused legal problems?: No  Social Support System:   Patient's Community Support System: Good Describe Community Support System: My friends are mostly in [place] but I have a few friends here too.  Type of faith/religion: Darrick Meigs How does patient's faith help to cope with current illness?: Prayer; I'm a member of a [employer].   Leisure/Recreation:  Leisure and Hobbies: crotchet; Barrister's clerk.   Strengths/Needs:    Strengths: hx of sobriety-"I know what I need to do-meetings and get back up with my sponsor."  Needs: anxious; lack  of effective coping skills, lacking insight, preoccupied with d/c, blaming others for her current situation.   Discharge Plan:   Does patient have access to transportation?: Yes (Car and licsense) Will patient be returning to same living situation after discharge?: Yes (return home with dad and stepmom at d/c) Currently receiving community mental health services: Yes (From Whom) (Dr. Durel Salts in [place].) If no, would patient like referral for services when discharged?: Yes (What county?) (-I just want to return to [place] and be with my sponsor as much as I can) Does patient have financial barriers related to discharge medications?: No  Summary/Recommendations:    Pt is 44 year old female living in [place], [place] ([place]) with her father and stepmother. She presents IVC to [facility] due to passive SI, alcohol abuse, bizarre/impulise/depressed mood. Pt has prior diagnosis of bipolar disorder and demonstrates some sx of mania including: rapid/pressured speech, tangential thought process, impulsivity, difficulty concentrating, and reports racing thoughts. Pt reports that she relapsed three days ago after 3 weeks of sobriety, where she went to detox and treatment at [facility]. Pt reports that her longest period of sobriety in has been 15 months "I went to meetings and AA. That helped me a lot." Pt reports having a problem with alcohol for the past 6-7 years. No other drug use identified although pt UDS positive for barbiturates and benzodiazepines. Pt denies SI/HI/AVH and reports high anxiety. Pt demanding medication and repeatedly asked if she would be seeing the doctor during assessment. Pt reports high anxiety and is requesting to d/c. Pt plans to continue seeing her psychiatrist in [facility] and is open to CSW making follow-up appt. Pt refused inpatient treatment, SAIOP, and therapy referrals at this time. PT to return home at d/c with father and stepfather. Her long-term goal is to move back to  [place]. Pt plans to follow-up with AA sponsor and begin attending meetings daily.   Smart, Brule LCSWA 01/08/2015

## 2015-01-08 NOTE — Progress Notes (Signed)
Attended group 

## 2015-01-08 NOTE — H&P (Signed)
Psychiatric Admission Assessment Adult  Patient Identification: Jessica [name] MRN:  [id number] Date of Evaluation:  01/08/2015 Chief Complaint:  Bipolar Anxiety Substance abuse Principal Diagnosis: <principal problem not specified> Diagnosis:   Patient Active Problem List   Diagnosis Date Noted  . Alcohol use disorder, severe, dependence [F10.20] 01/07/2015   History of Present Illness:: Had a relapse three or four days ago after 3 weeks of sobriety. States she has twitching in her left finger and she drank to help. A pint of Vodka and wine, 6 glasses when she did not drink the Vodka. States she just got out from the [employer] a week ago. States she has been dealing with alcohol issues for the last 10 years. She has been drinking "alcoholicly" for 7 years. States she came to [place] three and a half months as she has family here. She was in [place] was working had a boyfriend. They broke up and she came to [facility]. 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 States she has Bipolar Disorder, as of lately more depressed. She questions if it was a good idea to come to this area Elements:  Location:  alcohol dependence with an underlying bipolar disorder. Quality:  under more stress after she left [place] has seen a relapse of her alcohol dependence and exacerbation of her depression. Severity:  severe, unable to function from day to day . Timing:  drinking every day experiencing incresed depression every day. Duration:  worst in the last 3 and 1/2 months after she moved to [facility] from [place]. Context:  alcohol dependent experienced another relapsed incresingly frustrated as things are not workin out in [place]. Also with Bipolar Disorder experiencing exacerbation of her depressed phase. Associated Signs/Symptoms: Depression Symptoms:  depressed mood, anxiety, panic attacks, insomnia, disturbed sleep, (Hypo) Manic Symptoms:  Mania, last episode two and a  half a year ago. When manic has a "bad mania"  Anxiety Symptoms:  Excessive Worry, Panic Symptoms, Psychotic Symptoms:  Denies PTSD Symptoms: Negative Total Time spent with patient: 45 minutes  Past Medical History:  Past Medical History  Diagnosis Date  . Anxiety   . Bipolar 1 disorder    History reviewed. No pertinent past surgical history. Family History: History reviewed. No pertinent family history.                                Mental Illness an uncle has depressive disorder Social History:  History  Alcohol Use  . Yes     History  Drug Use No    History   Social History  . Marital Status: Single    Spouse Name: N/A    Number of Children: N/A  . Years of Education: N/A   Social History Main Topics  . Smoking status: Current Every Day Smoker    Types: Cigarettes  . Smokeless tobacco: None  . Alcohol Use: Yes  . Drug Use: No  . Sexual Activity: None   Other Topics Concern  . None   Social History Narrative  Single, came to [place] from [place] after she broke up with BF there. She has no children. She has a BA in history. Has worked as Geologist, engineering Social History:    Pain Medications: denies Prescriptions: denies abuse Over the Counter: denies History of alcohol / drug use?: Yes Longest period of sobriety (when/how long): 33month Negative Consequences of Use: Personal relationships, Work /  School Withdrawal Symptoms: Other (Comment) (Pt denies history of w/d symptoms) Name of Substance 1: alcohol 1 - Age of First Use: 15 1 - Amount (size/oz): 1pint 1 - Frequency: daily till last month in rehal 1 - Duration: 12 years  1 - Last Use / Amount: 01/07/15                   Musculoskeletal: Strength & Muscle Tone: within normal limits Gait & Station: normal Patient leans: N/A  Psychiatric Specialty Exam: Physical Exam  Review of Systems  Constitutional: Negative.   HENT: Negative.   Eyes: Negative.   Respiratory:        Cigarettes 10 a day  Cardiovascular: Negative.   Gastrointestinal: Negative.   Genitourinary: Negative.   Musculoskeletal: Negative.   Skin: Negative.   Neurological: Negative.   Endo/Heme/Allergies: Negative.   Psychiatric/Behavioral: Positive for depression and substance abuse. The patient is nervous/anxious.     Blood pressure 99/59, pulse 103, temperature 98.1 F (36.7 C), temperature source Oral, resp. rate 18, height '5\' 8"'  (1.727 m), weight 61.236 kg (135 lb).Body mass index is 20.53 kg/(m^2).  General Appearance: Fairly Groomed  Engineer, water::  Fair  Speech:  Clear and Coherent and at times child like  Volume:  fluctuates   Mood:  Anxious, Depressed and Dysphoric  Affect:  Depressed, Labile and Tearful  Thought Process:  Coherent and Goal Directed easily derailed  Orientation:  Full (Time, Place, and Person)  Thought Content:  symptoms events worries concerns, focused on wanting to be placed back on her medications and concerns about the twitch of her finger  Suicidal Thoughts:  No  Homicidal Thoughts:  No  Memory:  Immediate;   Fair Recent;   Fair Remote;   Fair  Judgement:  Fair  Insight:  Present and Shallow  Psychomotor Activity:  Restlessness and Tremor  Concentration:  Poor  Recall:  [employer] of Knowledge:Poor  Language: Fair  Akathisia:  No  Handed:  Right  AIMS (if indicated):     Assets:  Desire for Improvement  ADL's:  Intact  Cognition: WNL  Sleep:  Number of Hours: 4.25   Risk to Self: Is patient at risk for suicide?: No What has been your use of drugs/alcohol within the last 12 months?: Relapsed three days ago-I drank vodka and wine until I black out-mostly at night. Prior to relapse, I was in treatment at [facility] for 2 weeks and sober for 3 weeks. 15 months sobriety in past 6 years of adtive addiction. "meetings and IOP helped alot."  Risk to Others:   Prior Inpatient Therapy:   In [place] ( Dual Diagnosis) X 3 twice  detox, once for the Dx of Bipolar. Went to Residential  Prior Outpatient Therapy:  Yes in [place]  Alcohol Screening: 1. How often do you have a drink containing alcohol?: 2 to 3 times a week 2. How many drinks containing alcohol do you have on a typical day when you are drinking?: 10 or more 3. How often do you have six or more drinks on one occasion?: Weekly Preliminary Score: 7 4. How often during the last year have you found that you were not able to stop drinking once you had started?: Weekly 5. How often during the last year have you failed to do what was normally expected from you becasue of drinking?: Daily or almost daily 6. How often during the last year have you needed a first drink in the  morning to get yourself going after a heavy drinking session?: Monthly 7. How often during the last year have you had a feeling of guilt of remorse after drinking?: Daily or almost daily 8. How often during the last year have you been unable to remember what happened the night before because you had been drinking?: Weekly 9. Have you or someone else been injured as a result of your drinking?: No 10. Has a relative or friend or a doctor or another health worker been concerned about your drinking or suggested you cut down?: Yes, during the last year Alcohol Use Disorder Identification Test Final Score (AUDIT): 30 Brief Intervention: MD notified of score 20 or above  Allergies:  No Known Allergies Lab Results:  Results for orders placed or performed during the hospital encounter of 01/07/15 (from the past 48 hour(s))  Valproic acid level     Status: Abnormal   Collection Time: 01/07/15  5:11 PM  Result Value Ref Range   Valproic Acid Lvl 35.2 (L) 50.0 - 100.0 ug/mL    Comment: Performed at [facility]  CBC WITH DIFFERENTIAL     Status: Abnormal   Collection Time: 01/07/15  5:13 PM  Result Value Ref Range   WBC 8.8 4.0 - 10.5 K/uL   RBC 4.24 3.87 - 5.11 MIL/uL   Hemoglobin 13.2 12.0 -  15.0 g/dL   HCT 40.0 36.0 - 46.0 %   MCV 94.3 78.0 - 100.0 fL   MCH 31.1 26.0 - 34.0 pg   MCHC 33.0 30.0 - 36.0 g/dL   RDW 15.7 (H) 11.5 - 15.5 %   Platelets 338 150 - 400 K/uL   Neutrophils Relative % 72 43 - 77 %   Neutro Abs 6.3 1.7 - 7.7 K/uL   Lymphocytes Relative 18 12 - 46 %   Lymphs Abs 1.5 0.7 - 4.0 K/uL   Monocytes Relative 9 3 - 12 %   Monocytes Absolute 0.8 0.1 - 1.0 K/uL   Eosinophils Relative 0 0 - 5 %   Eosinophils Absolute 0.0 0.0 - 0.7 K/uL   Basophils Relative 1 0 - 1 %   Basophils Absolute 0.1 0.0 - 0.1 K/uL  Comprehensive metabolic panel     Status: Abnormal   Collection Time: 01/07/15  5:13 PM  Result Value Ref Range   Sodium 135 135 - 145 mmol/L   Potassium 4.1 3.5 - 5.1 mmol/L   Chloride 98 96 - 112 mmol/L   CO2 24 19 - 32 mmol/L   Glucose, Bld 154 (H) 70 - 99 mg/dL   BUN 6 6 - 23 mg/dL   Creatinine, Ser 0.50 0.50 - 1.10 mg/dL   Calcium 8.5 8.4 - 10.5 mg/dL   Total Protein 6.8 6.0 - 8.3 g/dL   Albumin 4.1 3.5 - 5.2 g/dL   AST 55 (H) 0 - 37 U/L   ALT 64 (H) 0 - 35 U/L   Alkaline Phosphatase 61 39 - 117 U/L   Total Bilirubin 0.3 0.3 - 1.2 mg/dL   GFR calc non Af Amer >90 >90 mL/min   GFR calc Af Amer >90 >90 mL/min    Comment: (NOTE) The eGFR has been calculated using the CKD EPI equation. This calculation has not been validated in all clinical situations. eGFR's persistently <90 mL/min signify possible Chronic Kidney Disease.    Anion gap 13 5 - 15  Ethanol     Status: Abnormal   Collection Time: 01/07/15  5:13 PM  Result Value Ref  Range   Alcohol, Ethyl (B) 278 (H) 0 - 9 mg/dL    Comment:        LOWEST DETECTABLE LIMIT FOR SERUM ALCOHOL IS 11 mg/dL FOR MEDICAL PURPOSES ONLY   Drug screen panel, emergency     Status: Abnormal   Collection Time: 01/07/15  5:29 PM  Result Value Ref Range   Opiates NONE DETECTED NONE DETECTED   Cocaine NONE DETECTED NONE DETECTED   Benzodiazepines POSITIVE (A) NONE DETECTED   Amphetamines NONE DETECTED NONE  DETECTED   Tetrahydrocannabinol NONE DETECTED NONE DETECTED   Barbiturates POSITIVE (A) NONE DETECTED    Comment:        DRUG SCREEN FOR MEDICAL PURPOSES ONLY.  IF CONFIRMATION IS NEEDED FOR ANY PURPOSE, NOTIFY LAB WITHIN 5 DAYS.        LOWEST DETECTABLE LIMITS FOR URINE DRUG SCREEN Drug Class       Cutoff (ng/mL) Amphetamine      1000 Barbiturate      200 Benzodiazepine   546 Tricyclics       503 Opiates          300 Cocaine          300 THC              50    Current Medications: Current Facility-Administered Medications  Medication Dose Route Frequency Provider Last Rate Last Dose  . acetaminophen (TYLENOL) tablet 650 mg  650 mg Oral Q6H PRN Delfin Gant, NP      . alum & mag hydroxide-simeth (MAALOX/MYLANTA) 200-200-20 MG/5ML suspension 30 mL  30 mL Oral Q4H PRN Delfin Gant, NP      . chlordiazePOXIDE (LIBRIUM) capsule 25 mg  25 mg Oral Q6H PRN Laverle Hobby, PA-C   25 mg at 01/08/15 0420  . haloperidol (HALDOL) tablet 5 mg  5 mg Oral Q6H PRN Laverle Hobby, PA-C   5 mg at 01/08/15 0809  . hydrOXYzine (ATARAX/VISTARIL) tablet 25 mg  25 mg Oral Q6H PRN Laverle Hobby, PA-C   25 mg at 01/08/15 0810  . Influenza vac split quadrivalent PF (FLUARIX) injection 0.5 mL  0.5 mL Intramuscular Tomorrow-1000 Maurine Minister Simon, PA-C      . loperamide (IMODIUM) capsule 2-4 mg  2-4 mg Oral PRN Laverle Hobby, PA-C      . magnesium hydroxide (MILK OF MAGNESIA) suspension 30 mL  30 mL Oral Daily PRN Delfin Gant, NP      . multivitamin with minerals tablet 1 tablet  1 tablet Oral Daily Laverle Hobby, PA-C   1 tablet at 01/08/15 0809  . ondansetron (ZOFRAN-ODT) disintegrating tablet 4 mg  4 mg Oral Q6H PRN Laverle Hobby, PA-C      . pneumococcal 23 valent vaccine (PNU-IMMUNE) injection 0.5 mL  0.5 mL Intramuscular Tomorrow-1000 Laverle Hobby, PA-C      . traZODone (DESYREL) tablet 50 mg  50 mg Oral QHS PRN,MR X 1 Spencer E Simon, PA-C   50 mg at 01/08/15 0216   PTA  Medications: Prescriptions prior to admission  Medication Sig Dispense Refill Last Dose  . Asenapine Maleate 10 MG SUBL Place 10 mg under the tongue daily.   01/06/2015 at 2100  . busPIRone (BUSPAR) 5 MG tablet Take 5 mg by mouth 3 (three) times daily.   01/06/2015 at Unknown time  . divalproex (DEPAKOTE) 500 MG DR tablet Take 500 mg by mouth 2 (two) times daily.   01/06/2015 at 2100  .  gabapentin (NEURONTIN) 400 MG capsule Take 400 mg by mouth 3 (three) times daily.   Past Week at Unknown time  . hydrOXYzine (ATARAX/VISTARIL) 25 MG tablet Take 25 mg by mouth 3 (three) times daily as needed for anxiety (anxiety).   01/06/2015 at Unknown time    Previous Psychotropic Medications: Yes as above  Substance Abuse History in the last 12 months:  Yes.      Consequences of Substance Abuse: Withdrawal Symptoms:   Diaphoresis Tremors  Results for orders placed or performed during the hospital encounter of 01/07/15 (from the past 72 hour(s))  Valproic acid level     Status: Abnormal   Collection Time: 01/07/15  5:11 PM  Result Value Ref Range   Valproic Acid Lvl 35.2 (L) 50.0 - 100.0 ug/mL    Comment: Performed at [facility]  CBC WITH DIFFERENTIAL     Status: Abnormal   Collection Time: 01/07/15  5:13 PM  Result Value Ref Range   WBC 8.8 4.0 - 10.5 K/uL   RBC 4.24 3.87 - 5.11 MIL/uL   Hemoglobin 13.2 12.0 - 15.0 g/dL   HCT 40.0 36.0 - 46.0 %   MCV 94.3 78.0 - 100.0 fL   MCH 31.1 26.0 - 34.0 pg   MCHC 33.0 30.0 - 36.0 g/dL   RDW 15.7 (H) 11.5 - 15.5 %   Platelets 338 150 - 400 K/uL   Neutrophils Relative % 72 43 - 77 %   Neutro Abs 6.3 1.7 - 7.7 K/uL   Lymphocytes Relative 18 12 - 46 %   Lymphs Abs 1.5 0.7 - 4.0 K/uL   Monocytes Relative 9 3 - 12 %   Monocytes Absolute 0.8 0.1 - 1.0 K/uL   Eosinophils Relative 0 0 - 5 %   Eosinophils Absolute 0.0 0.0 - 0.7 K/uL   Basophils Relative 1 0 - 1 %   Basophils Absolute 0.1 0.0 - 0.1 K/uL  Comprehensive metabolic panel     Status:  Abnormal   Collection Time: 01/07/15  5:13 PM  Result Value Ref Range   Sodium 135 135 - 145 mmol/L   Potassium 4.1 3.5 - 5.1 mmol/L   Chloride 98 96 - 112 mmol/L   CO2 24 19 - 32 mmol/L   Glucose, Bld 154 (H) 70 - 99 mg/dL   BUN 6 6 - 23 mg/dL   Creatinine, Ser 0.50 0.50 - 1.10 mg/dL   Calcium 8.5 8.4 - 10.5 mg/dL   Total Protein 6.8 6.0 - 8.3 g/dL   Albumin 4.1 3.5 - 5.2 g/dL   AST 55 (H) 0 - 37 U/L   ALT 64 (H) 0 - 35 U/L   Alkaline Phosphatase 61 39 - 117 U/L   Total Bilirubin 0.3 0.3 - 1.2 mg/dL   GFR calc non Af Amer >90 >90 mL/min   GFR calc Af Amer >90 >90 mL/min    Comment: (NOTE) The eGFR has been calculated using the CKD EPI equation. This calculation has not been validated in all clinical situations. eGFR's persistently <90 mL/min signify possible Chronic Kidney Disease.    Anion gap 13 5 - 15  Ethanol     Status: Abnormal   Collection Time: 01/07/15  5:13 PM  Result Value Ref Range   Alcohol, Ethyl (B) 278 (H) 0 - 9 mg/dL    Comment:        LOWEST DETECTABLE LIMIT FOR SERUM ALCOHOL IS 11 mg/dL FOR MEDICAL PURPOSES ONLY   Drug screen panel, emergency  Status: Abnormal   Collection Time: 01/07/15  5:29 PM  Result Value Ref Range   Opiates NONE DETECTED NONE DETECTED   Cocaine NONE DETECTED NONE DETECTED   Benzodiazepines POSITIVE (A) NONE DETECTED   Amphetamines NONE DETECTED NONE DETECTED   Tetrahydrocannabinol NONE DETECTED NONE DETECTED   Barbiturates POSITIVE (A) NONE DETECTED    Comment:        DRUG SCREEN FOR MEDICAL PURPOSES ONLY.  IF CONFIRMATION IS NEEDED FOR ANY PURPOSE, NOTIFY LAB WITHIN 5 DAYS.        LOWEST DETECTABLE LIMITS FOR URINE DRUG SCREEN Drug Class       Cutoff (ng/mL) Amphetamine      1000 Barbiturate      200 Benzodiazepine   161 Tricyclics       096 Opiates          300 Cocaine          300 THC              50     Observation Level/Precautions:  15 minute checks  Laboratory:  As per the ED   Psychotherapy:   Individual/group  Medications:  Librium detox if needed, resume her medications  Consultations:    Discharge Concerns:    Estimated LOS: 3-5 days  Other:     Psychological Evaluations: No   Treatment Plan Summary: Daily contact with patient to assess and evaluate symptoms and progress in treatment and Medication management  Will identify any S/S of withdrawal and use Librium to detox Alcohol Dependence: as above and relapse prevention, identify the presence of cravings and consider using Campral Bipolar Disorder, currently depressed: resume her medications and optimize response Reassess for the need of further residential treatment " they let me go too soon as insurance would not pay" Will check TSH, Lipid Profile Medical Decision Making:  Review of Psycho-Social Stressors (1), Review or order clinical lab tests (1), Established Problem, Worsening (2), Review of Medication Regimen & Side Effects (2) and Review of New Medication or Change in Dosage (2)  I certify that inpatient services furnished can reasonably be expected to improve the patient's condition.   Donovan Gatchel A 1/27/201610:32 AM

## 2015-01-08 NOTE — Progress Notes (Signed)
Patient ID: Jessica [name], female   DOB: 10/19/1971, 44 y.o.   MRN: [id number] Received report from Zenia Resides D: client anxious, asking for medications. A: Writer introduced self to client, explained that the on call physician will be notified and after pharmacy reviews and verifies medications, they will be available. Staff will monitor q54min for safety. R: Client showered and sat in day room to wait for medications. When released Probation officer reviewed and administered.
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
## 2015-01-08 NOTE — BHH Group Notes (Signed)
[facility] LCSW Aftercare Discharge Planning Group Note   01/08/2015 9:28 AM  Participation Quality:  Disruptive but redirectable   Mood/Affect:  Anxious and Irritable  Depression Rating:  0  Anxiety Rating:  10 "I want to leave."   Thoughts of Suicide:  No Will you contract for safety?   NA  Current AVH:  No   Plan for Discharge/Comments:  Pt reporting that she need her Depakote asap (500mg ). Pt reports that she lives with her parents in [place] ([place]). Pt reports that she recently completed tx at [facility] and relapsed three days ago. Pt refusing referral for i/p treatment at this time-"I just want to finish up AA and move back to [facility] with some friends." pt reports that she sees psychiatrist Doristine Church (private practice) in [place] for med management and is not interested in therapy referral for SA IOP at this time. CSW assessing. Pt adamant that she wants to d/c asap.    Transportation Means: parents   Supports: parents   Smart, Research officer, trade union

## 2015-01-08 NOTE — Progress Notes (Signed)
D: Pt appears anxious on approach. Pt presents with rapid and tangential speech. Pt thoughts are disorganized, loose association, and racing thoughts. Pt denies having any hallucinations but appears to be responding to internal stimuli. Pt wide-eyed, paranoid and ask repetitive questions. Pt asked to be started back on meds, Seroquel, Neurontin Depakote and Saphris. Pt appears disheveled and fidgety.  A: medications administered as ordered per MD. Prn meds given.  MD aware of pt request. Verbal support given. Pt encouraged to attend groups. 15 minute checks performed for safety.  R: Pt has no insight for treatment. Pt requesting to be discharged.

## 2015-01-09 DIAGNOSIS — G4700 Insomnia, unspecified: Secondary | ICD-10-CM

## 2015-01-09 MED ORDER — MIRTAZAPINE 30 MG PO TABS
30.0000 mg | ORAL_TABLET | Freq: Every day | ORAL | Status: DC
  Administered 2015-01-09 – 2015-01-11 (×3): 30 mg via ORAL
  Filled 2015-01-09: qty 1
  Filled 2015-01-09: qty 4
  Filled 2015-01-09 (×3): qty 1
  Filled 2015-01-09: qty 4

## 2015-01-09 MED ORDER — GABAPENTIN 400 MG PO CAPS
400.0000 mg | ORAL_CAPSULE | Freq: Every day | ORAL | Status: DC
  Administered 2015-01-09 – 2015-01-11 (×3): 400 mg via ORAL
  Filled 2015-01-09 (×5): qty 1

## 2015-01-09 MED ORDER — METHOCARBAMOL 500 MG PO TABS
500.0000 mg | ORAL_TABLET | Freq: Four times a day (QID) | ORAL | Status: DC | PRN
  Administered 2015-01-09 – 2015-01-10 (×2): 500 mg via ORAL
  Filled 2015-01-09 (×2): qty 1

## 2015-01-09 NOTE — Progress Notes (Signed)
D:  Patient's self inventory sheet, patient slept good last night, no sleep medication given.  Good appetite, high energy level, good concentration.  Denied depression and hopeless, rated anxiety #3.  Continues to experience tremors.  Denied SI.  Denied physical problems.  Denied physical pain.  Goal is to focus on recovery and will think positively.  No discharge plan.  No problems anticipated with discharge. A:  Medications administered per MD orders.  Emotional support and encouragement given patient. R:  Denied SI and HI, contracts for safety.  Denied A/V hallucinations.  Safety maintained with 15 minute checks.

## 2015-01-09 NOTE — Progress Notes (Signed)
[facility] MD Progress Note  01/09/2015 3:10 PM Jessica [name]  MRN:  [id number] Subjective: Admits she is extremely anxious. She did sleep some better last night after sleeping only one hour the night before. States she was exhausted. Worried about the possibility of having Tardive Dyskinesia given the twitches in her fingers and toes ( states she does not actually see them most of the times, but feels them) " I wonder if it is in my head" She puts her fingers on ice and this seems to help. She was off the medications for several days in a row but was drinking heavily during those days. She is afraid that she wont sleep without the Saphris but understands that Saphris could cause dyskinetic movements. She would probably go back to [facility] once she gets to feel better. She had been given Neurontin for the twitches and feels it might be helping some. ( she asked to be seen several times during the day and be reassured she does not have TD) Principal Problem: Alcohol use disorder, severe, dependence Diagnosis:   Patient Active Problem List   Diagnosis Date Noted  . Bipolar I disorder, most recent episode depressed [F31.30] 01/08/2015  . Alcohol use disorder, severe, dependence [F10.20] 01/07/2015   Total Time spent with patient: 30 minutes   Past Medical History:  Past Medical History  Diagnosis Date  . Anxiety   . Bipolar 1 disorder    History reviewed. No pertinent past surgical history. Family History: History reviewed. No pertinent family history. Social History:  History  Alcohol Use  . Yes     History  Drug Use No    History   Social History  . Marital Status: Single    Spouse Name: N/A    Number of Children: N/A  . Years of Education: N/A   Social History Main Topics  . Smoking status: Current Every Day Smoker    Types: Cigarettes  . Smokeless tobacco: None  . Alcohol Use: Yes  . Drug Use: No  . Sexual Activity: None   Other Topics Concern  . None   Social History  Narrative   Additional History:    Sleep: Poor  Appetite:  Poor   Assessment:   Musculoskeletal: Strength & Muscle Tone: within normal limits Gait & Station: normal Patient leans: N/A   Psychiatric Specialty Exam: Physical Exam  ROS  Blood pressure 119/64, pulse 96, temperature 97.8 F (36.6 C), temperature source Oral, resp. rate 18, height 5\' 8"  (1.727 m), weight 61.236 kg (135 lb).Body mass index is 20.53 kg/(m^2).  General Appearance: Fairly Groomed  Engineer, water::  Fair  Speech:  Clear and Coherent  Volume:  Decreased  Mood:  Anxious, Depressed, Dysphoric and worried  Affect:  anxious worried sad at times to the point of tears  Thought Process:  Coherent and Goal Directed  Orientation:  Full (Time, Place, and Person)  Thought Content:  Rumination and symptoms worries concerns, somcatically focused  Suicidal Thoughts:  No  Homicidal Thoughts:  No  Memory:  Immediate;   Fair Recent;   Fair Remote;   Fair  Judgement:  Fair  Insight:  Present and Shallow  Psychomotor Activity:  Restlessness  Concentration:  Poor  Recall:  [employer] of Knowledge:Fair  Language: Fair  Akathisia:  No  Handed:  Right  AIMS (if indicated):     Assets:  Desire for Improvement Social Support  ADL's:  Intact  Cognition: WNL  Sleep:  Number of Hours: 5  Current Medications: Current Facility-Administered Medications  Medication Dose Route Frequency Provider Last Rate Last Dose  . acetaminophen (TYLENOL) tablet 650 mg  650 mg Oral Q6H PRN Delfin Gant, NP      . alum & mag hydroxide-simeth (MAALOX/MYLANTA) 200-200-20 MG/5ML suspension 30 mL  30 mL Oral Q4H PRN Delfin Gant, NP      . busPIRone (BUSPAR) tablet 5 mg  5 mg Oral TID Nicholaus Bloom, MD   5 mg at 01/09/15 1255  . chlordiazePOXIDE (LIBRIUM) capsule 25 mg  25 mg Oral Q6H PRN Laverle Hobby, PA-C   25 mg at 01/09/15 1331  . chlordiazePOXIDE (LIBRIUM) capsule 25 mg  25 mg Oral QHS PRN Nicholaus Bloom, MD   25 mg  at 01/08/15 2143  . divalproex (DEPAKOTE) DR tablet 500 mg  500 mg Oral BID Nicholaus Bloom, MD   500 mg at 01/09/15 0932  . gabapentin (NEURONTIN) capsule 400 mg  400 mg Oral TID Nicholaus Bloom, MD   400 mg at 01/09/15 1254  . gabapentin (NEURONTIN) capsule 400 mg  400 mg Oral QHS Nicholaus Bloom, MD      . hydrOXYzine (ATARAX/VISTARIL) tablet 25 mg  25 mg Oral TID PRN Nicholaus Bloom, MD   25 mg at 01/08/15 1827  . Influenza vac split quadrivalent PF (FLUARIX) injection 0.5 mL  0.5 mL Intramuscular Tomorrow-1000 Maurine Minister Simon, PA-C   0.5 mL at 01/08/15 1157  . loperamide (IMODIUM) capsule 2-4 mg  2-4 mg Oral PRN Laverle Hobby, PA-C      . magnesium hydroxide (MILK OF MAGNESIA) suspension 30 mL  30 mL Oral Daily PRN Delfin Gant, NP      . methocarbamol (ROBAXIN) tablet 500 mg  500 mg Oral Q6H PRN Nicholaus Bloom, MD      . mirtazapine (REMERON) tablet 30 mg  30 mg Oral QHS Nicholaus Bloom, MD      . multivitamin with minerals tablet 1 tablet  1 tablet Oral Daily Laverle Hobby, PA-C   1 tablet at 01/09/15 3557  . ondansetron (ZOFRAN-ODT) disintegrating tablet 4 mg  4 mg Oral Q6H PRN Laverle Hobby, PA-C      . pneumococcal 23 valent vaccine (PNU-IMMUNE) injection 0.5 mL  0.5 mL Intramuscular Tomorrow-1000 Laverle Hobby, PA-C   0.5 mL at 01/08/15 1158    Lab Results:  Results for orders placed or performed during the hospital encounter of 01/07/15 (from the past 48 hour(s))  TSH     Status: None   Collection Time: 01/08/15  7:36 PM  Result Value Ref Range   TSH 2.858 0.350 - 4.500 uIU/mL    Comment: Performed at [facility]  Lipid panel     Status: None   Collection Time: 01/08/15  7:36 PM  Result Value Ref Range   Cholesterol 158 0 - 200 mg/dL   Triglycerides 83 <150 mg/dL   HDL 85 >39 mg/dL   Total CHOL/HDL Ratio 1.9 RATIO   VLDL 17 0 - 40 mg/dL   LDL Cholesterol 56 0 - 99 mg/dL    Comment:        Total Cholesterol/HDL:CHD Risk Coronary Heart Disease Risk Table                      Men   Women  1/2 Average Risk   3.4   3.3  Average Risk       5.0  4.4  2 X Average Risk   9.6   7.1  3 X Average Risk  23.4   11.0        Use the calculated Patient Ratio above and the CHD Risk Table to determine the patient's CHD Risk.        ATP III CLASSIFICATION (LDL):  <100     mg/dL   Optimal  100-129  mg/dL   Near or Above                    Optimal  130-159  mg/dL   Borderline  160-189  mg/dL   High  >190     mg/dL   Very High Performed at [facility]     Physical Findings: AIMS: Facial and Oral Movements Muscles of Facial Expression: None, normal Lips and Perioral Area: None, normal Jaw: None, normal Tongue: None, normal,Extremity Movements Upper (arms, wrists, hands, fingers): None, normal Lower (legs, knees, ankles, toes): None, normal, Trunk Movements Neck, shoulders, hips: None, normal, Overall Severity Severity of abnormal movements (highest score from questions above): None, normal Incapacitation due to abnormal movements: None, normal Patient's awareness of abnormal movements (rate only patient's report): No Awareness, Dental Status Current problems with teeth and/or dentures?: No Does patient usually wear dentures?: No  CIWA:  CIWA-Ar Total: 9 COWS:  COWS Total Score: 7  Treatment Plan Summary: Daily contact with patient to assess and evaluate symptoms and progress in treatment and Medication management  Alcohol Dependence: continue to use Librium to manage any S/S of  withdrawal Bipolar Disorder: will continue the Depakote and add Lamictal in an attempt to let go of the Saphris as we try to determine if the Saphris is causing the twitches in her fingers, toes. She states the twitches have been going for the last 4 weeks. She has been on Saphris for years now. Before that she was on Risperdal and Depakote. Reports no changes in medications in the last 4 weeks.  Finger and toes twitches: will reassess without the Saphris Insomnia: will  add Neurontin 400 mg with Remeron 30 mg at HS, she says the Trazodone up to 300 mg have not help  Medical Decision Making:  Review of Psycho-Social Stressors (1), Review or order clinical lab tests (1), Order AIMS Test (2), Review of Medication Regimen & Side Effects (2) and Review of New Medication or Change in Dosage (2)     Konica Stankowski A 01/09/2015, 3:10 PM

## 2015-01-09 NOTE — BHH Group Notes (Signed)
[facility] LCSW Group Therapy  01/09/2015 2:31 PM  Type of Therapy:  Group Therapy  Participation Level:  Active  Participation Quality:  Attentive  Affect:  Appropriate  Cognitive:  Alert and Oriented  Insight:  Improving  Engagement in Therapy:  Improving  Modes of Intervention:  Confrontation, Discussion, Education, Exploration, Problem-solving, Rapport Building, Socialization and Support  Summary of Progress/Problems:  Finding Balance in Life. Today's group focused on defining balance in one's own words, identifying things that can knock one off balance, and exploring healthy ways to maintain balance in life. Group members were asked to provide an example of a time when they felt off balance, describe how they handled that situation,and process healthier ways to regain balance in the future. Group members were asked to share the most important tool for maintaining balance that they learned while at [facility] and how they plan to apply this method after discharge. Dura was attentive and engaged during today's processing group. She shared that "sobriety" was the number one priority for remaining balanced. "When I drink it affects my whole life-my relationship with my parents, my ability to work and function--everything." Persais stated that she is rethinking her plan to move back to [facility] and is planning to look for work in the area while continuing to attend AA and reconnect with her sponsor. Parissa shows progress in the group setting and improving insight AEB her ability to process how sobriety can help her reestablish balance in other facets of life. She also talked with another pt about job openings and demonstrated motivation to get the application process started.   Smart, Kenzly Rogoff  LCSWA  01/09/2015, 2:31 PM

## 2015-01-09 NOTE — Progress Notes (Signed)
Patient will discuss her medications with MD tomorrow.   Not sure if she will be taking lamictal.

## 2015-01-09 NOTE — Plan of Care (Signed)
Problem: Consults Goal: Suicide Risk Patient Education (See Patient Education module for education specifics)  Outcome: Completed/Met Date Met:  01/09/15 Nurse discussed suicidal information with patient.

## 2015-01-09 NOTE — Progress Notes (Signed)
Patient ID: Jessica [name], female   DOB: 1971/05/07, 44 y.o.   MRN: [id number] D: Client is visible on the unit, visiting with mom and dad. Client reports day went "well" and that she had received orders her home medications. A: Writer reviewed medications and administered as ordered, provided emotional support. Staff will monitor q28min for safety. R: client is safe on the unit, attended group.

## 2015-01-09 NOTE — Progress Notes (Signed)
Adult Psychoeducational Group Note  Date:  01/09/2015 Time:  8:42 PM  Group Topic/Focus:  Wrap-Up Group:   The focus of this group is to help patients review their daily goal of treatment and discuss progress on daily workbooks.  Participation Level:  Active  Participation Quality:  Attentive  Affect:  Appropriate  Cognitive:  Appropriate  Insight: Good  Engagement in Group:  Engaged  Modes of Intervention:  Discussion  Additional Comments:  Pt goal was to work on recovery and to accomplish a more positive attitude. Pt has accomplish her goal by maintaining a positive attitude along with coming up with a plan for recovery.   Quentin Angst 01/09/2015, 8:42 PM

## 2015-01-09 NOTE — BHH Group Notes (Signed)
The focus of this group is to educate the patient on the purpose and policies of crisis stabilization and provide a format to answer questions about their admission.  The group details unit policies and expectations of patients while admitted.  Patient attended 0900 nurse education orientation group this morning.  Patient actively participated, appropriate affect, alert, appropriate insight and engagement.  Today patient will work on 3 goals for discharge.  

## 2015-01-10 DIAGNOSIS — F419 Anxiety disorder, unspecified: Secondary | ICD-10-CM

## 2015-01-10 MED ORDER — LAMOTRIGINE 25 MG PO TABS
25.0000 mg | ORAL_TABLET | ORAL | Status: DC
  Administered 2015-01-10 – 2015-01-12 (×2): 25 mg via ORAL
  Filled 2015-01-10 (×2): qty 1
  Filled 2015-01-10: qty 2

## 2015-01-10 MED ORDER — CHLORDIAZEPOXIDE HCL 25 MG PO CAPS
25.0000 mg | ORAL_CAPSULE | Freq: Four times a day (QID) | ORAL | Status: DC | PRN
  Administered 2015-01-10 – 2015-01-11 (×2): 25 mg via ORAL
  Filled 2015-01-10 (×2): qty 1

## 2015-01-10 NOTE — BHH Group Notes (Signed)
[facility] LCSW Aftercare Discharge Planning Group Note   01/10/2015 9:34 AM  Participation Quality:  Appropriate   Mood/Affect:  Anxious and Appropriate  Depression Rating:  0  Anxiety Rating:  2-3 "I'm curious about why I didn't get my Lamictal at 8AM."   Thoughts of Suicide:  No Will you contract for safety?   NA  Current AVH:  No  Plan for Discharge/Comments:  Pt reports that she is feeling well today and is hoping for d/c by Sunday. Pt agreeable to allowing CSW to make follow-up at with her psychiatrist in [place]. Pt still refusing any other follow-up and plans to continue AA meetings at d/c. Reports good sleep today.   Transportation Means: father  Supports: Scientist, research (medical), Research officer, trade union

## 2015-01-10 NOTE — BHH Group Notes (Signed)
[facility] LCSW Group Therapy  01/10/2015 4:15 PM  Type of Therapy:  Group Therapy  Participation Level:  Active  Participation Quality:  Attentive  Affect:  Anxious and Appropriate  Cognitive:  Alert and Oriented  Insight:  Improving  Engagement in Therapy:  Engaged  Modes of Intervention:  Confrontation, Discussion, Education, Exploration, Problem-solving, Rapport Building, Socialization and Support  Summary of Progress/Problems: Feelings around Relapse. Group members discussed the meaning of relapse and shared personal stories of relapse, how it affected them and others, and how they perceived themselves during this time. Group members were encouraged to identify triggers, warning signs and coping skills used when facing the possibility of relapse. Social supports were discussed and explored in detail. Jessica [name] was attentive and engaged during today's processing group. She shared that boredom and not having stable employment are two primary triggers for her depression and subsequent alcoholism. Jessica [name] shared "this is enough. I can't keep doing this to myself." She talked about her previous periods of sobriety and identified "what worked." She continues to demonstrate improving insight and progress in the group setting.    Smart, Johnedward Brodrick LCSWA  01/10/2015, 4:15 PM

## 2015-01-10 NOTE — Progress Notes (Signed)
Nutrition Brief Note  Patient identified on the Malnutrition Screening Tool (MST) Report  Per RN, pt is eating adequately and eating snacks as well.  Wt Readings from Last 15 Encounters:  01/07/15 135 lb (61.236 kg)    Body mass index is 20.53 kg/(m^2). Patient meets criteria for normal based on current BMI.   Diet Order: Diet regular Pt is also offered choice of unit snacks mid-morning and mid-afternoon.  Pt is eating as desired.  Labs and medications reviewed.   No nutrition interventions warranted at this time. If nutrition issues arise, please consult RD.   Clayton Bibles, MS, RD, LDN Pager: [phone] After Hours Pager: [phone]

## 2015-01-10 NOTE — Progress Notes (Signed)
  [facility] Adult Case Management Discharge Plan :  Will you be returning to the same living situation after discharge:  Yes,  home with dad At discharge, do you have transportation home?: Yes,  dad coming at 1:30PM on Sun Do you have the ability to pay for your medications: Yes,  [employer] of information consent forms completed and submitted to Medical Records by CSW.  Patient to Follow up at: Follow-up Information    Follow up with Dr. Seymour Bars, MD.   Why:  Voicemail left for Dr. Buena Irish in attempt to make follow-up appt. If he does not call by Monday afternoon, please call his office to schedule hospital follow-up/medication management appt. Thank you!   Contact information:   [address] [place], [place] [address] Phone: [phone] Fax: [phone]      Follow up with Patient refused referral for outpatient therapy and SA IOP. She plans to continue with AA meetings.      Patient denies SI/HI: Yes,  during group/self report.    Safety Planning and Suicide Prevention discussed: Yes,  SPE completed with pt's father. SPI pamphlet provided to pt and she was encouraged to share information with support network, ask questions, and talk about any concerns relating to SPE.  Has patient been referred to the Quitline?: N/A patient is not a smoker  Smart, Alicia Amel 01/10/2015, 3:42 PM

## 2015-01-10 NOTE — Progress Notes (Signed)
Writer spoke with patient 1:1 and she requested to speak with Dr. Sabra Heck concerning her lamictal. She reports that her father read up on lamictal and it is contraindicated with depakote. Writer assured her that she would be able to speak to the doctor concerning this matter. Patient reports that she is interested in IOP and encouraged her to discuss this with her Education officer, museum. She is compliant with scheduled medications and request a prn librium at bedtime for twitching. She denies si/hi/a/v hallucinations. She voiced no other complaints. Safety maintained on unit with 15 min checks.
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
## 2015-01-10 NOTE — Progress Notes (Signed)
[facility] MD Progress Note  01/10/2015 4:11 PM Jessica [name]  MRN:  [id number] Subjective:  Jessica [name] has continued to experience anxiety, restlessness. She is still worried about the muscle twitching. She admits she might have experienced a decrease in them. She was able to sleep last night without the Saphris. She is agreeable to try the Lamictal added to the Depakote. She became increasingly more anxious and concerned when the Lamictal was not ordered to be taken at 8 AM this morning. She states she is still experiencing tremors that she believes are secondary to coming off the alcohol. She was planning to go back to [facility] but heard from her BF that he did not want her back. States that when she was busy working she did the best.  Principal Problem: Alcohol use disorder, severe, dependence Diagnosis:   Patient Active Problem List   Diagnosis Date Noted  . Bipolar I disorder, most recent episode depressed [F31.30] 01/08/2015  . Alcohol use disorder, severe, dependence [F10.20] 01/07/2015   Total Time spent with patient: 30 minutes   Past Medical History:  Past Medical History  Diagnosis Date  . Anxiety   . Bipolar 1 disorder    History reviewed. No pertinent past surgical history. Family History: History reviewed. No pertinent family history. Social History:  History  Alcohol Use  . Yes     History  Drug Use No    History   Social History  . Marital Status: Single    Spouse Name: N/A    Number of Children: N/A  . Years of Education: N/A   Social History Main Topics  . Smoking status: Current Every Day Smoker    Types: Cigarettes  . Smokeless tobacco: None  . Alcohol Use: Yes  . Drug Use: No  . Sexual Activity: None   Other Topics Concern  . None   Social History Narrative   Additional History:    Sleep: Fair  Appetite:  Fair   Assessment:   Musculoskeletal: Strength & Muscle Tone: within normal limits Gait & Station: normal Patient leans:  N/A   Psychiatric Specialty Exam: Physical Exam  Review of Systems  Constitutional: Negative.   HENT: Negative.   Eyes: Negative.   Respiratory: Negative.   Cardiovascular: Negative.   Gastrointestinal: Negative.   Genitourinary: Negative.   Musculoskeletal: Positive for myalgias.  Skin: Negative.   Neurological: Positive for tingling and tremors.  Endo/Heme/Allergies: Negative.   Psychiatric/Behavioral: Positive for depression and substance abuse. The patient is nervous/anxious and has insomnia.     Blood pressure 104/72, pulse 88, temperature 97.8 F (36.6 C), temperature source Oral, resp. rate 16, height 5\' 8"  (1.727 m), weight 61.236 kg (135 lb).Body mass index is 20.53 kg/(m^2).  General Appearance: Fairly Groomed  Engineer, water::  Fair  Speech:  Clear and Coherent  Volume:  fluctuates  Mood:  Anxious, Depressed, Dysphoric and worried  Affect:  Restricted and anxious worried "robotic"  Thought Process:  Coherent and Goal Directed  Orientation:  Full (Time, Place, and Person)  Thought Content:  symptoms worries concerns  Suicidal Thoughts:  No  Homicidal Thoughts:  No  Memory:  Immediate;   Fair Recent;   Fair Remote;   Fair  Judgement:  Fair  Insight:  Present and Shallow  Psychomotor Activity:  Restlessness  Concentration:  Poor  Recall:  [employer] of Knowledge:Fair  Language: Fair  Akathisia:  No  Handed:  Right  AIMS (if indicated):     Assets:  Desire for Improvement  Housing  ADL's:  Intact  Cognition: WNL  Sleep:  Number of Hours: 6.5     Current Medications: Current Facility-Administered Medications  Medication Dose Route Frequency Provider Last Rate Last Dose  . acetaminophen (TYLENOL) tablet 650 mg  650 mg Oral Q6H PRN Delfin Gant, NP      . alum & mag hydroxide-simeth (MAALOX/MYLANTA) 200-200-20 MG/5ML suspension 30 mL  30 mL Oral Q4H PRN Delfin Gant, NP      . busPIRone (BUSPAR) tablet 5 mg  5 mg Oral TID Nicholaus Bloom, MD   5 mg  at 01/10/15 1152  . chlordiazePOXIDE (LIBRIUM) capsule 25 mg  25 mg Oral QHS PRN Nicholaus Bloom, MD   25 mg at 01/09/15 2142  . chlordiazePOXIDE (LIBRIUM) capsule 25 mg  25 mg Oral Q6H PRN Nicholaus Bloom, MD   25 mg at 01/10/15 1446  . divalproex (DEPAKOTE) DR tablet 500 mg  500 mg Oral BID Nicholaus Bloom, MD   500 mg at 01/10/15 0175  . gabapentin (NEURONTIN) capsule 400 mg  400 mg Oral TID Nicholaus Bloom, MD   400 mg at 01/10/15 1153  . gabapentin (NEURONTIN) capsule 400 mg  400 mg Oral QHS Nicholaus Bloom, MD   400 mg at 01/09/15 2130  . hydrOXYzine (ATARAX/VISTARIL) tablet 25 mg  25 mg Oral TID PRN Nicholaus Bloom, MD   25 mg at 01/08/15 1827  . Influenza vac split quadrivalent PF (FLUARIX) injection 0.5 mL  0.5 mL Intramuscular Tomorrow-1000 Maurine Minister Simon, PA-C   0.5 mL at 01/08/15 1157  . lamoTRIgine (LAMICTAL) tablet 25 mg  25 mg Oral QODAY Nicholaus Bloom, MD   25 mg at 01/10/15 1153  . loperamide (IMODIUM) capsule 2-4 mg  2-4 mg Oral PRN Laverle Hobby, PA-C      . magnesium hydroxide (MILK OF MAGNESIA) suspension 30 mL  30 mL Oral Daily PRN Delfin Gant, NP      . methocarbamol (ROBAXIN) tablet 500 mg  500 mg Oral Q6H PRN Nicholaus Bloom, MD   500 mg at 01/10/15 1008  . mirtazapine (REMERON) tablet 30 mg  30 mg Oral QHS Nicholaus Bloom, MD   30 mg at 01/09/15 2130  . multivitamin with minerals tablet 1 tablet  1 tablet Oral Daily Laverle Hobby, PA-C   1 tablet at 01/10/15 1025  . ondansetron (ZOFRAN-ODT) disintegrating tablet 4 mg  4 mg Oral Q6H PRN Laverle Hobby, PA-C      . pneumococcal 23 valent vaccine (PNU-IMMUNE) injection 0.5 mL  0.5 mL Intramuscular Tomorrow-1000 Laverle Hobby, PA-C   0.5 mL at 01/08/15 1158    Lab Results:  Results for orders placed or performed during the hospital encounter of 01/07/15 (from the past 48 hour(s))  TSH     Status: None   Collection Time: 01/08/15  7:36 PM  Result Value Ref Range   TSH 2.858 0.350 - 4.500 uIU/mL    Comment: Performed at  [facility]  Lipid panel     Status: None   Collection Time: 01/08/15  7:36 PM  Result Value Ref Range   Cholesterol 158 0 - 200 mg/dL   Triglycerides 83 <150 mg/dL   HDL 85 >39 mg/dL   Total CHOL/HDL Ratio 1.9 RATIO   VLDL 17 0 - 40 mg/dL   LDL Cholesterol 56 0 - 99 mg/dL    Comment:        Total  Cholesterol/HDL:CHD Risk Coronary Heart Disease Risk Table                     Men   Women  1/2 Average Risk   3.4   3.3  Average Risk       5.0   4.4  2 X Average Risk   9.6   7.1  3 X Average Risk  23.4   11.0        Use the calculated Patient Ratio above and the CHD Risk Table to determine the patient's CHD Risk.        ATP III CLASSIFICATION (LDL):  <100     mg/dL   Optimal  100-129  mg/dL   Near or Above                    Optimal  130-159  mg/dL   Borderline  160-189  mg/dL   High  >190     mg/dL   Very High Performed at [facility]     Physical Findings: AIMS: Facial and Oral Movements Muscles of Facial Expression: None, normal Lips and Perioral Area: None, normal Jaw: None, normal Tongue: None, normal,Extremity Movements Upper (arms, wrists, hands, fingers): None, normal Lower (legs, knees, ankles, toes): None, normal, Trunk Movements Neck, shoulders, hips: None, normal, Overall Severity Severity of abnormal movements (highest score from questions above): None, normal Incapacitation due to abnormal movements: None, normal Patient's awareness of abnormal movements (rate only patient's report): No Awareness, Dental Status Current problems with teeth and/or dentures?: No Does patient usually wear dentures?: No  CIWA:  CIWA-Ar Total: 2 COWS:  COWS Total Score: 7  Treatment Plan Summary: Daily contact with patient to assess and evaluate symptoms and progress in treatment and Medication management Alcohol Dependence: address the withdrawal symptoms                                    Address the cravings, develop a relapse prevention plan Bipolar  Depression; reassess her mood stability once the Saphris had been D/C. Start trial with Lamictal 25 mg Q O Day. ( she was educated about the skin rash) Severe anxiety: use Librium to help with the anxiety ( and course tremors) on a short term basis Insomnia: continue the Remeron at 30 mg HS and the Neurontin 400 mg at HS and reasess   Medical Decision Making:  Review of Psycho-Social Stressors (1), Review of Medication Regimen & Side Effects (2) and Review of New Medication or Change in Dosage (2)     Aleeta Schmaltz A 01/10/2015, 4:11 PM

## 2015-01-10 NOTE — Progress Notes (Signed)
D: Patient denies SI/HI and A/V hallucinations; patient reports sleep is good; reports appetite is good; reports energy level  Is normal; reports ability to concentrate is good; rates depression as 0/10; rates hopelessness 0/10; rates anxiety as 2/10; patient is reporting the tremors   A: Monitored q 15 minutes; patient encouraged to attend groups; patient educated about medications; patient given medications per physician orders; patient encouraged to express feelings and/or concerns  R: Patient was very anxious about taking her Lamictal and did not understand that the medicine has not been ordered; patient has to be reassured several times; patient is cooperative on the unit; patient is fidgeting;  patient's interaction with staff and peers is minimal; patient was able to set goal to talk with staff 1:1 when having feelings of SI; patient is taking medications as prescribed and tolerating medications; patient is attending all groups

## 2015-01-10 NOTE — BHH Suicide Risk Assessment (Signed)
[place] INPATIENT:  Family/Significant Other Suicide Prevention Education  Suicide Prevention Education:  Education Completed; Deloyce Walthers (pt's father) [phone] has been identified by the patient as the family member/significant other with whom the patient will be residing, and identified as the person(s) who will aid the patient in the event of a mental health crisis (suicidal ideations/suicide attempt).  With written consent from the patient, the family member/significant other has been provided the following suicide prevention education, prior to the and/or following the discharge of the patient.  The suicide prevention education provided includes the following:  Suicide risk factors  Suicide prevention and interventions  National Suicide Hotline telephone number  [facility] assessment telephone number  [facility] Emergency Assistance [place] and/or Residential Mobile Crisis Unit telephone number  Request made of family/significant other to:  Remove weapons (e.g., guns, rifles, knives), all items previously/currently identified as safety concern.    Remove drugs/medications (over-the-counter, prescriptions, illicit drugs), all items previously/currently identified as a safety concern.  The family member/significant other verbalizes understanding of the suicide prevention education information provided.  The family member/significant other agrees to remove the items of safety concern listed above.  Smart, Rohen Kimes LCSWA 01/10/2015, 3:42 PM

## 2015-01-10 NOTE — Progress Notes (Signed)
Adult Psychoeducational Group Note  Date:  01/10/2015 Time:  2:02 PM  Group Topic/Focus:  Developing a Wellness Toolbox:   The focus of this group is to help patients develop a "wellness toolbox" with skills and strategies to promote recovery upon discharge.  Participation Level:  Active  Participation Quality:  Appropriate, Attentive and Sharing  Affect:  Depressed and Flat  Cognitive:  Alert and Appropriate  Insight: Appropriate  Engagement in Group:  Engaged  Modes of Intervention:  Activity, Clarification, Discussion, Education and Support  Additional Comments:  Gratitude Journaling Pt attended most of the group on the importance of having gratitude in one's life and created a list of 10 things she is thankful for. Pt shared that she is grateful for family support and for personal qualities she has. Pt appeared to understand the importance of giving thanks when one feels depressed or stressed out to elevate mood. Pt stated that she felt uplifted after doing this exercise and staff encouraged her to continue this practice daily. Pt appeared to und  Carolyne Littles F 01/10/2015, 2:02 PM

## 2015-01-11 MED ORDER — BENZTROPINE MESYLATE 1 MG PO TABS
1.0000 mg | ORAL_TABLET | Freq: Once | ORAL | Status: AC
  Administered 2015-01-11: 1 mg via ORAL
  Filled 2015-01-11: qty 1

## 2015-01-11 MED ORDER — BENZTROPINE MESYLATE 1 MG PO TABS
ORAL_TABLET | ORAL | Status: AC
  Filled 2015-01-11: qty 1

## 2015-01-11 NOTE — BHH Group Notes (Signed)
[place] Group Notes:  (Nursing/MHT/Case Management/Adjunct)  Date:  01/11/2015  Time:  2:58 PM  Type of Therapy:  Psychoeducational Skills  Participation Level:  Active  Participation Quality:  Appropriate  Affect:  Appropriate  Cognitive:  Appropriate  Insight:  Appropriate  Engagement in Group:  Engaged  Modes of Intervention:  Discussion  Summary of Progress/Problems: Pt did attend healthy coping skills group, pt reported that her coping skill was to work on cross word puzzles.     Benancio Deeds Shanta 01/11/2015, 2:58 PM

## 2015-01-11 NOTE — BHH Group Notes (Signed)
[place] Group Notes:  (Clinical Social Work)  01/11/2015     10:30-11AM  Summary of Progress/Problems:   The main focus of today's process group was to discuss what the patients would like to work on changing in their lives, and how they might go about it.    The patient stated she is hospitalized due to being an alcoholic and having Bipolar Disorder.  She stated she wants to "learn how not to drink."  She stated she had been sober at one point for 15 months, but has relapsed several times in the last few months since moving to [place].  She stated her previous success happened after she went through detox and IOP.  She is looking for work, and is bored and frustrated when unsuccessful, which leads to drinking.  Type of Therapy:  Group Therapy - Process   Participation Level:  Minimal  Participation Quality:  Attentive  Affect:  Depressed and Flat  Cognitive:  Appropriate  Insight:  Developing/Improving  Engagement in Therapy:  Developing/Improving  Modes of Intervention:  Education, Motivational Interviewing  Selmer Dominion, LCSW 01/11/2015, 2:41 PM

## 2015-01-11 NOTE — Progress Notes (Addendum)
Patient ID: Jessica [name], female   DOB: [birth year], 44 y.o.   MRN: [id number]   D: Pt has been very flat and depressed on the unit today, patient has been very focused on her medications. Pt reported that her dad looked up her medications and seen where you can not take Depakote and Lamictal. Pt was also worried about ticks, all concerns were given to DR. Denmark new orders were noted. Pt reported that she was ready for discharge tomorrow, because [facility] was not helping her. Pt has reported severe anxiety and has required several prns with very little relief.  Pt reported being negative SI/HI, no AH/VH noted. A: 15 min checks continued for patient safety. R: Pt safety maintained.

## 2015-01-11 NOTE — BHH Group Notes (Signed)
[place] Group Notes:  (Nursing/MHT/Case Management/Adjunct)  Date:  01/11/2015  Time:  2:44 PM  Type of Therapy:  Psychoeducational Skills  Participation Level:  Active  Participation Quality:  Appropriate  Affect:  Appropriate  Cognitive:  Appropriate  Insight:  Appropriate  Engagement in Group:  Engaged  Modes of Intervention:  Discussion  Summary of Progress/Problems: Pt did attend self inventory group, pt reported that she was negative SI/HI, no AH/VH noted. Pt rated her depression as a 0, and her helplessness/hopelessness as a 0.     Pt reported that she wanted to see the doctor because her dad looked up her medication and read where Depakote and Lamictal should not be taken together. Dr. Sabra Heck made aware.   Benancio Deeds Shanta 01/11/2015, 2:44 PM

## 2015-01-11 NOTE — Progress Notes (Signed)
[facility] MD Progress Note  01/11/2015 8:06 PM Jessica [name]  MRN:  [id number] Subjective:  Jessica [name] to worry about the twitch in her fingers as well as toes. States she thought they were getting better but now she is not sure. Admits it gets better when she is distracted on not paying attention to them. She is still sleeping well. She was concerned about taking the Depakote and the Lamictal together Principal Problem: Alcohol use disorder, severe, dependence Diagnosis:   Patient Active Problem List   Diagnosis Date Noted  . Bipolar I disorder, most recent episode depressed [F31.30] 01/08/2015  . Alcohol use disorder, severe, dependence [F10.20] 01/07/2015   Total Time spent with patient: 30 minutes   Past Medical History:  Past Medical History  Diagnosis Date  . Anxiety   . Bipolar 1 disorder    History reviewed. No pertinent past surgical history. Family History: History reviewed. No pertinent family history. Social History:  History  Alcohol Use  . Yes     History  Drug Use No    History   Social History  . Marital Status: Single    Spouse Name: N/A    Number of Children: N/A  . Years of Education: N/A   Social History Main Topics  . Smoking status: Current Every Day Smoker    Types: Cigarettes  . Smokeless tobacco: None  . Alcohol Use: Yes  . Drug Use: No  . Sexual Activity: None   Other Topics Concern  . None   Social History Narrative   Additional History:    Sleep: Fair  Appetite:  Fair   Assessment:   Musculoskeletal: Strength & Muscle Tone: within normal limits Gait & Station: normal Patient leans: N/A   Psychiatric Specialty Exam: Physical Exam  Review of Systems  Constitutional: Negative.   HENT: Negative.   Eyes: Negative.   Respiratory: Negative.   Cardiovascular: Negative.   Gastrointestinal: Negative.   Genitourinary: Negative.   Musculoskeletal: Negative.   Skin: Negative.   Neurological: Positive for tremors.   Endo/Heme/Allergies: Negative.   Psychiatric/Behavioral: Positive for substance abuse. The patient is nervous/anxious.     Blood pressure 98/55, pulse 81, temperature 97.9 F (36.6 C), temperature source Oral, resp. rate 16, height 5\' 8"  (1.727 m), weight 61.236 kg (135 lb).Body mass index is 20.53 kg/(m^2).  General Appearance: Fairly Groomed  Engineer, water::  Fair  Speech:  Clear and Coherent and hesitant  Volume:  Decreased  Mood:  Anxious and worried  Affect:  anxious worried  Thought Process:  Coherent and Goal Directed  Orientation:  Full (Time, Place, and Person)  Thought Content:  worries, concerns, hyperfocused on the twitching of her fingers  Suicidal Thoughts:  No  Homicidal Thoughts:  No  Memory:  Immediate;   Fair Recent;   Fair Remote;   Fair  Judgement:  Fair  Insight:  Present and Shallow  Psychomotor Activity:  Restlessness  Concentration:  Fair  Recall:  [employer] of Knowledge:Fair  Language: Fair  Akathisia:  No  Handed:  Right  AIMS (if indicated):     Assets:  Desire for Improvement Housing Social Support  ADL's:  Intact  Cognition: WNL  Sleep:  Number of Hours: 6.75     Current Medications: Current Facility-Administered Medications  Medication Dose Route Frequency Provider Last Rate Last Dose  . acetaminophen (TYLENOL) tablet 650 mg  650 mg Oral Q6H PRN Delfin Gant, NP      . alum & mag hydroxide-simeth (MAALOX/MYLANTA)  200-200-20 MG/5ML suspension 30 mL  30 mL Oral Q4H PRN Delfin Gant, NP      . busPIRone (BUSPAR) tablet 5 mg  5 mg Oral TID Nicholaus Bloom, MD   5 mg at 01/11/15 1729  . chlordiazePOXIDE (LIBRIUM) capsule 25 mg  25 mg Oral QHS PRN Nicholaus Bloom, MD   25 mg at 01/10/15 2120  . chlordiazePOXIDE (LIBRIUM) capsule 25 mg  25 mg Oral Q6H PRN Nicholaus Bloom, MD   25 mg at 01/11/15 4098  . divalproex (DEPAKOTE) DR tablet 500 mg  500 mg Oral BID Nicholaus Bloom, MD   500 mg at 01/11/15 1729  . gabapentin (NEURONTIN) capsule 400 mg   400 mg Oral TID Nicholaus Bloom, MD   400 mg at 01/11/15 1729  . gabapentin (NEURONTIN) capsule 400 mg  400 mg Oral QHS Nicholaus Bloom, MD   400 mg at 01/10/15 2120  . hydrOXYzine (ATARAX/VISTARIL) tablet 25 mg  25 mg Oral TID PRN Nicholaus Bloom, MD   25 mg at 01/11/15 1950  . Influenza vac split quadrivalent PF (FLUARIX) injection 0.5 mL  0.5 mL Intramuscular Tomorrow-1000 Maurine Minister Simon, PA-C   0.5 mL at 01/08/15 1157  . lamoTRIgine (LAMICTAL) tablet 25 mg  25 mg Oral QODAY Nicholaus Bloom, MD   25 mg at 01/10/15 1153  . magnesium hydroxide (MILK OF MAGNESIA) suspension 30 mL  30 mL Oral Daily PRN Delfin Gant, NP      . methocarbamol (ROBAXIN) tablet 500 mg  500 mg Oral Q6H PRN Nicholaus Bloom, MD   500 mg at 01/10/15 1008  . mirtazapine (REMERON) tablet 30 mg  30 mg Oral QHS Nicholaus Bloom, MD   30 mg at 01/10/15 2120  . multivitamin with minerals tablet 1 tablet  1 tablet Oral Daily Laverle Hobby, PA-C   1 tablet at 01/11/15 0805  . pneumococcal 23 valent vaccine (PNU-IMMUNE) injection 0.5 mL  0.5 mL Intramuscular Tomorrow-1000 Maurine Minister Simon, PA-C   0.5 mL at 01/08/15 1158    Lab Results: No results found for this or any previous visit (from the past 48 hour(s)).  Physical Findings: AIMS: Facial and Oral Movements Muscles of Facial Expression: None, normal Lips and Perioral Area: None, normal Jaw: None, normal Tongue: None, normal,Extremity Movements Upper (arms, wrists, hands, fingers): None, normal Lower (legs, knees, ankles, toes): None, normal, Trunk Movements Neck, shoulders, hips: None, normal, Overall Severity Severity of abnormal movements (highest score from questions above): None, normal Incapacitation due to abnormal movements: None, normal Patient's awareness of abnormal movements (rate only patient's report): No Awareness, Dental Status Current problems with teeth and/or dentures?: No Does patient usually wear dentures?: No  CIWA:  CIWA-Ar Total: 0 COWS:  COWS Total  Score: 7  Treatment Plan Summary: Daily contact with patient to assess and evaluate symptoms and progress in treatment and Medication management Bipolar depression: med education repeated in that with Depakote it would be safe to start the Lamictal at 25 mg Q other day Twitching of finger: reassured that if it was coming from the Saphris it might take more time before it completely goes away. In terms of the possibility of Depakote causing the twitching there is also a possibility probably less that the Saphris. She has an appointment with a neurologist February the 2. She still would like to try some medication to help the twitching. Explained that it would be more important to find out where  it is coming from. She would still like to try Cogentin. Will give a trial of Cogentin 1 mg and will reassess afterwards.    Medical Decision Making:  Review of Psycho-Social Stressors (1), Review of Medication Regimen & Side Effects (2) and Review of New Medication or Change in Dosage (2)     Jessica [name] A 01/11/2015, 8:06 PM

## 2015-01-12 ENCOUNTER — Emergency Department (HOSPITAL_COMMUNITY)
Admission: EM | Admit: 2015-01-12 | Discharge: 2015-01-13 | Disposition: A | Discharge status: Home / Self Care | Payer: 59 | Attending: Emergency Medicine | Admitting: Emergency Medicine

## 2015-01-12 ENCOUNTER — Encounter (HOSPITAL_COMMUNITY): Payer: Self-pay | Admitting: Emergency Medicine

## 2015-01-12 DIAGNOSIS — Z720 Tobacco use: Secondary | ICD-10-CM | POA: Insufficient documentation

## 2015-01-12 DIAGNOSIS — R45851 Suicidal ideations: Secondary | ICD-10-CM

## 2015-01-12 DIAGNOSIS — F10929 Alcohol use, unspecified with intoxication, unspecified: Secondary | ICD-10-CM | POA: Diagnosis not present

## 2015-01-12 DIAGNOSIS — F419 Anxiety disorder, unspecified: Secondary | ICD-10-CM | POA: Insufficient documentation

## 2015-01-12 DIAGNOSIS — F319 Bipolar disorder, unspecified: Secondary | ICD-10-CM | POA: Insufficient documentation

## 2015-01-12 DIAGNOSIS — Z79899 Other long term (current) drug therapy: Secondary | ICD-10-CM | POA: Insufficient documentation

## 2015-01-12 DIAGNOSIS — F1310 Sedative, hypnotic or anxiolytic abuse, uncomplicated: Secondary | ICD-10-CM | POA: Insufficient documentation

## 2015-01-12 DIAGNOSIS — F10920 Alcohol use, unspecified with intoxication, uncomplicated: Secondary | ICD-10-CM

## 2015-01-12 MED ORDER — MIRTAZAPINE 30 MG PO TABS: 30.0000 mg | ORAL_TABLET | Freq: Every day | ORAL | Status: DC

## 2015-01-12 MED ORDER — LORAZEPAM 2 MG/ML IJ SOLN
0.0000 mg | Freq: Two times a day (BID) | INTRAMUSCULAR | Status: DC
  Administered 2015-01-12: 2 mg via INTRAVENOUS
  Filled 2015-01-12: qty 1

## 2015-01-12 MED ORDER — VITAMIN B-1 100 MG PO TABS: 100.0000 mg | ORAL_TABLET | Freq: Every day | ORAL | Status: DC

## 2015-01-12 MED ORDER — LORAZEPAM 1 MG PO TABS: 0.0000 mg | ORAL_TABLET | Freq: Four times a day (QID) | ORAL | Status: DC

## 2015-01-12 MED ORDER — LORAZEPAM 1 MG PO TABS
0.0000 mg | ORAL_TABLET | Freq: Two times a day (BID) | ORAL | Status: DC
  Administered 2015-01-13: 2 mg via ORAL
  Filled 2015-01-12 (×2): qty 2

## 2015-01-12 MED ORDER — LAMOTRIGINE 25 MG PO TABS: 25.0000 mg | ORAL_TABLET | ORAL | Status: DC

## 2015-01-12 MED ORDER — BUSPIRONE HCL 5 MG PO TABS: 5.0000 mg | ORAL_TABLET | Freq: Three times a day (TID) | ORAL | Status: DC

## 2015-01-12 MED ORDER — BENZTROPINE MESYLATE 1 MG PO TABS: 1.0000 mg | ORAL_TABLET | Freq: Two times a day (BID) | ORAL | Status: DC | PRN

## 2015-01-12 MED ORDER — LORAZEPAM 2 MG/ML IJ SOLN: 0.0000 mg | Freq: Four times a day (QID) | INTRAMUSCULAR | Status: DC

## 2015-01-12 MED ORDER — GABAPENTIN 400 MG PO CAPS
ORAL_CAPSULE | ORAL | Status: DC
Start: 2015-01-12 — End: 2015-02-25

## 2015-01-12 MED ORDER — BENZTROPINE MESYLATE 1 MG PO TABS
1.0000 mg | ORAL_TABLET | Freq: Two times a day (BID) | ORAL | Status: DC | PRN
  Filled 2015-01-12: qty 8

## 2015-01-12 MED ORDER — GABAPENTIN 400 MG PO CAPS: 400.0000 mg | ORAL_CAPSULE | Freq: Three times a day (TID) | ORAL | Status: DC

## 2015-01-12 MED ORDER — HYDROXYZINE HCL 25 MG PO TABS: 25.0000 mg | ORAL_TABLET | Freq: Three times a day (TID) | ORAL | Status: DC | PRN

## 2015-01-12 MED ORDER — THIAMINE HCL 100 MG/ML IJ SOLN: 100.0000 mg | Freq: Every day | INTRAMUSCULAR | Status: DC

## 2015-01-12 MED ORDER — DIVALPROEX SODIUM 500 MG PO DR TAB: 500.0000 mg | DELAYED_RELEASE_TABLET | Freq: Two times a day (BID) | ORAL | Status: DC

## 2015-01-12 NOTE — ED Notes (Signed)
Pt moved to rm 8, placed on bedpan. Pt cooperative. Family went home.

## 2015-01-12 NOTE — BHH Group Notes (Signed)
[place] Group Notes:  (Nursing/MHT/Case Management/Adjunct)  Date:  01/12/2015  Time:  12:52 PM  Type of Therapy:  Psychoeducational Skills  Participation Level:  Active  Participation Quality:  Appropriate  Affect:  Appropriate  Cognitive:  Appropriate  Insight:  Appropriate  Engagement in Group:  Engaged  Modes of Intervention:  Problem-solving  Summary of Progress/Problems: Pt attended healthy support systems group.  Jessica [name] 01/12/2015, 12:52 PM

## 2015-01-12 NOTE — ED Notes (Signed)
Patient was released from [facility] today and returned but is now heavily intoxicated. Requesting ativan. Accompanied by father. Alert at this point.

## 2015-01-12 NOTE — Plan of Care (Signed)
Problem: Diagnosis: Increased Risk For Suicide Attempt Goal: STG-Patient Will Comply With Medication Regime Outcome: Progressing Patient is compliant with scheduled medications.     

## 2015-01-12 NOTE — Progress Notes (Signed)
Writer spoke with patient and her father who would like to be with her during the discharge process. He wants to be here so that he can know concerning her care since she will be living with him. Patient is looking forward to discharge but a little anxious. She denies si/hi/a/v hallucinations. Support and encouragement given along with a prn of visteril.

## 2015-01-12 NOTE — BHH Suicide Risk Assessment (Signed)
[facility] Discharge Suicide Risk Assessment   Demographic Factors:  Caucasian  Total Time spent with patient: 30 minutes  Musculoskeletal: Strength & Muscle Tone: within normal limits Gait & Station: normal Patient leans: N/A  Psychiatric Specialty Exam: Physical Exam  Review of Systems  Constitutional: Negative.   HENT: Negative.   Eyes: Negative.   Respiratory: Negative.   Cardiovascular: Negative.   Gastrointestinal: Negative.   Genitourinary: Negative.   Musculoskeletal: Negative.   Skin: Negative.   Neurological: Negative.   Endo/Heme/Allergies: Negative.   Psychiatric/Behavioral: Positive for suicidal ideas. The patient is nervous/anxious.     Blood pressure 95/43, pulse 81, temperature 98.8 F (37.1 C), temperature source Oral, resp. rate 20, height 5\' 8"  (1.727 m), weight 61.236 kg (135 lb).Body mass index is 20.53 kg/(m^2).  General Appearance: Fairly Groomed  Engineer, water::  Fair  Speech:  Clear and [id number]  Volume:  Normal  Mood:  Anxious and worried  Affect:  Restricted  Thought Process:  Coherent and Goal Directed  Orientation:  Full (Time, Place, and Person)  Thought Content:  plans as she moves on, relapse prevention plan  Suicidal Thoughts:  No  Homicidal Thoughts:  No  Memory:  Immediate;   Fair Recent;   Fair Remote;   Fair  Judgement:  Fair  Insight:  Present  Psychomotor Activity:  Normal  Concentration:  Fair  Recall:  [employer] of [place]  Language: Fair  Akathisia:  No  Handed:  Right  AIMS (if indicated):     Assets:  Desire for Improvement Housing Social Support Vocational/Educational  Sleep:  Number of Hours: 6.75  Cognition: WNL  ADL's:  Intact   Have you used any form of tobacco in the last 30 days? (Cigarettes, Smokeless Tobacco, Cigars, and/or Pipes): Yes  Has this patient used any form of tobacco in the last 30 days? (Cigarettes, Smokeless Tobacco, Cigars, and/or Pipes) Yes, A prescription for an FDA-approved tobacco  cessation medication was offered at discharge and the patient refused  Mental Status Per Nursing Assessment::   On Admission:  NA  Current Mental Status by Physician: In full contact with reality. There are no active S/S of withdrawal. There are no active SI plans or intent. Her mood is "better." states that the cogentin help the twitching. Would like to pursue it further.    Loss Factors: Decline in physical health  Historical Factors: NA  Risk Reduction Factors:   Living with another person, especially a relative and Positive social support  Continued Clinical Symptoms:  Bipolar Disorder:   Depressive phase Alcohol/Substance Abuse/Dependencies  Cognitive Features That Contribute To Risk:  Closed-mindedness, Polarized thinking and Thought constriction (tunnel vision)    Suicide Risk:  Minimal: No identifiable suicidal ideation.  Patients presenting with no risk factors but with morbid ruminations; may be classified as minimal risk based on the severity of the depressive symptoms  Principal Problem: Alcohol use disorder, severe, dependence Discharge Diagnoses:  Patient Active Problem List   Diagnosis Date Noted  . Bipolar I disorder, most recent episode depressed [F31.30] 01/08/2015  . Alcohol use disorder, severe, dependence [F10.20] 01/07/2015    Follow-up Information    Follow up with Dr. Seymour Bars, MD.   Why:  Voicemail left for Dr. Buena Irish in attempt to make follow-up appt. If he does not call by Monday afternoon, please call his office to schedule hospital follow-up/medication management appt. Thank you!   Contact information:   [address] [place], [place] [address] Phone: [phone] Fax:  [phone]      Follow up with Patient refused referral for outpatient therapy and SA IOP. She plans to continue with AA meetings.      Plan Of Care/Follow-up recommendations:  Activity:  as tolerated Diet:  regular Follow up with her psychiatrist in [place]. Will have a neurological evaluation to reassess the twitching of her fingers. Will to to [place] Is patient on multiple antipsychotic therapies at discharge:  No   Has Patient had three or more failed trials of antipsychotic monotherapy by history:  No  Recommended Plan for Multiple Antipsychotic Therapies: NA    Ardeth Repetto A 01/12/2015, 10:30 AM

## 2015-01-12 NOTE — BHH Group Notes (Signed)
[facility] Group Notes:  (Nursing/MHT/Case Management/Adjunct)  Date:  01/12/2015  Time:  12:50 PM  Type of Therapy:  Psychoeducational Skills  Participation Level:  Active  Participation Quality:  Appropriate  Affect:  Appropriate  Cognitive:  Appropriate  Insight:  Appropriate  Engagement in Group:  Engaged  Modes of Intervention:  Discussion  Summary of Progress/Problems: Pt did attend self inventory group, pt reported that she was negative SI/HI, no AH/VH noted. Pt rated her depression as a 0, and her helplessness/hopelessness as a 0.     Pt reported no issues or concerns.   Benancio Deeds Shanta 01/12/2015, 12:50 PM

## 2015-01-12 NOTE — Discharge Summary (Signed)
Physician Discharge Summary Note  Patient:  Jessica [name] is an 44 y.o., female MRN:  [id number] DOB:  09-Aug-1971 Patient phone:  [phone] (home)  Patient address:   [address] [place] [address],  Total Time spent with patient: Greater than 30 minutes  Date of Admission:  01/07/2015  Date of Discharge: 01/12/15  Reason for Admission: Alcohol detoxification/mood stabilization treatments  Principal Problem: Alcohol use disorder, severe, dependence Discharge Diagnoses: Patient Active Problem List   Diagnosis Date Noted  . Bipolar I disorder, most recent episode depressed [F31.30] 01/08/2015  . Alcohol use disorder, severe, dependence [F10.20] 01/07/2015   Musculoskeletal: Strength & Muscle Tone: within normal limits Gait & Station: normal Patient leans: N/A  Psychiatric Specialty Exam: Physical Exam  Psychiatric: Her speech is normal and behavior is normal. Judgment and thought content normal. Her mood appears not anxious. Her affect is not angry, not blunt, not labile and not inappropriate. Cognition and memory are normal. She does not exhibit a depressed mood.    Review of Systems  Constitutional: Negative.   HENT: Negative.   Eyes: Negative.   Respiratory: Negative.   Cardiovascular: Negative.   Gastrointestinal: Negative.   Genitourinary: Negative.   Skin: Negative.   Neurological: Negative.   Endo/Heme/Allergies: Negative.   Psychiatric/Behavioral: Positive for depression (Stable) and substance abuse (Alcoholism, chronic (HX)). Negative for suicidal ideas, hallucinations and memory loss. The patient has insomnia (Stable). The patient is not nervous/anxious.     Blood pressure 95/43, pulse 81, temperature 98.8 F (37.1 C), temperature source Oral, resp. rate 20, height 5\' 8"  (1.727 m), weight 61.236 kg (135 lb).Body mass index is 20.53 kg/(m^2).  See MD's SRA   Past Medical History:  Past Medical History  Diagnosis Date  . Anxiety   . Bipolar 1  disorder    History reviewed. No pertinent past surgical history. Family History: History reviewed. No pertinent family history. Social History:  History  Alcohol Use  . Yes     History  Drug Use No    History   Social History  . Marital Status: Single    Spouse Name: N/A    Number of Children: N/A  . Years of Education: N/A   Social History Main Topics  . Smoking status: Current Every Day Smoker    Types: Cigarettes  . Smokeless tobacco: None  . Alcohol Use: Yes  . Drug Use: No  . Sexual Activity: None   Other Topics Concern  . None   Social History Narrative   Risk to Self: Is patient at risk for suicide?: No What has been your use of drugs/alcohol within the last 12 months?: Relapsed three days ago-I drank vodka and wine until I black out-mostly at night. Prior to relapse, I was in treatment at [facility] for 2 weeks and sober for 3 weeks. 15 months sobriety in past 6 years of adtive addiction. "meetings and IOP helped alot."   Risk to Others: Yes  Prior Inpatient Therapy: Yes  Prior Outpatient Therapy: Yes  Level of Care:  OP  Hospital Course:  Had a relapse three or four days ago after 3 weeks of sobriety. States she has twitching in her left finger and she drank to help. A pint of Vodka and wine, 6 glasses when she did not drink the Vodka. States she just got out from the [employer] a week ago. States she has been dealing with alcohol issues for the last 10 years. She has been drinking "alcoholicly" for 7 years.  States she came to [place] three and a half months as she has family here. She was in [place] was working had a boyfriend. They broke up and she came to [facility]. 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 States she has Bipolar Disorder, as of lately more depressed. She questions if it was a good idea to come to this area.  Jessica [name] was admitted to the hospital with her UDS test results positive for Barbiturates &  Benzodiazepine. Her toxicology tests indicated a BAL of 363. She was intoxicated and well as presenting with substance withdrawal symptoms. She has history of bipolar disorder. She was in need of substance detoxification treatments. Jessica [name] received Librium detox protocols for alcohol/drug detoxifications. She also received medication management for mood stabilization treatments. She was medicated and discharged on Cogentin 1 mg  tablet for twitchings to her fingers, Buspar 5 mg for anxiety, Depakote 500 mg for mood stabilization,Gabpentin 400 mg for agitation/substantce withdrawal syndrome, Hydroxyzine 25 mg prn for anxiety, Lamictal 25 mg for mood stabilization & Mirtazapine 30 mg for depression/insomnia. She was also enrolled in the group counseling sessions & AA/NA meetings being offered and held on this unit. She learned coping skills. Jessica [name] tolerated her treatment regimen without any significant adverse effects & or reactions.  Jessica [name] has completed detox treatments and her mood is stable. This is evidenced by her reports of improved mood & absence of substance withdrawal syndrome. She is currently being discharged to continue psychiatric/substance abuse treatment on an outpatient basis. However, Jessica [name] has refused a follow-up care appointment & referrals for counseling sessions & substance abuse treatment. She has agreed to follow-up with Dr. Joseph Art in [place] area. She is provided with all the pertinent information required to make this appointment without problems.  Upon discharge, Jessica [name] adamantly denies any SIHI, AVH, delusional thoughts, paranoia and or substance withdrawal symptoms. She is provided with a 4 days worth, supply samples of her [facility] discharge medications. She left [facility] with all personal belongings in no apparent distress. Transportation per patient's arrangement.  Consults:  psychiatry  Significant Diagnostic Studies:  labs: CBC with diff, CMP, UDS, toxicology  tests, U/A, results, reviewed, stable  Discharge Vitals:   Blood pressure 95/43, pulse 81, temperature 98.8 F (37.1 C), temperature source Oral, resp. rate 20, height 5\' 8"  (1.727 m), weight 61.236 kg (135 lb). Body mass index is 20.53 kg/(m^2). Lab Results:   No results found for this or any previous visit (from the past 72 hour(s)).  Physical Findings: AIMS: Facial and Oral Movements Muscles of Facial Expression: None, normal Lips and Perioral Area: None, normal Jaw: None, normal Tongue: None, normal,Extremity Movements Upper (arms, wrists, hands, fingers): None, normal Lower (legs, knees, ankles, toes): None, normal, Trunk Movements Neck, shoulders, hips: None, normal, Overall Severity Severity of abnormal movements (highest score from questions above): None, normal Incapacitation due to abnormal movements: None, normal Patient's awareness of abnormal movements (rate only patient's report): No Awareness, Dental Status Current problems with teeth and/or dentures?: No Does patient usually wear dentures?: No  CIWA:  CIWA-Ar Total: 0 COWS:  COWS Total Score: 7   See Psychiatric Specialty Exam and Suicide Risk Assessment completed by Attending Physician prior to discharge.  Discharge destination:  Home  Is patient on multiple antipsychotic therapies at discharge:  No   Has Patient had three or more failed trials of antipsychotic monotherapy by history:  No  Recommended Plan for Multiple Antipsychotic Therapies: NA    Medication List  STOP taking these medications        Asenapine Maleate 10 MG Subl      TAKE these medications      Indication   benztropine 1 MG tablet  Commonly known as:  COGENTIN  Take 1 tablet (1 mg total) by mouth 2 (two) times daily as needed for tremors.   Indication:  Extrapyramidal Reaction caused by Medications     busPIRone 5 MG tablet  Commonly known as:  BUSPAR  Take 1 tablet (5 mg total) by mouth 3 (three) times daily. For anxiety    Indication:  Generalized Anxiety Disorder     divalproex 500 MG DR tablet  Commonly known as:  DEPAKOTE  Take 1 tablet (500 mg total) by mouth 2 (two) times daily. For mood stabilization   Indication:  Mood stabilization     gabapentin 400 MG capsule  Commonly known as:  NEURONTIN  Take 1 capsule (400 mg) four times daily: For agitation   Indication:  Agitation     hydrOXYzine 25 MG tablet  Commonly known as:  ATARAX/VISTARIL  Take 1 tablet (25 mg total) by mouth 3 (three) times daily as needed for anxiety (anxiety).   Indication:  Anxiety     lamoTRIgine 25 MG tablet  Commonly known as:  LAMICTAL  Take 1 tablet (25 mg total) by mouth every other day. For mood stabilization   Indication:  Mood stabilization     mirtazapine 30 MG tablet  Commonly known as:  REMERON  Take 1 tablet (30 mg total) by mouth at bedtime. For depression/sleep   Indication:  Trouble Sleeping, Major Depressive Disorder       Follow-up Information    Follow up with Dr. Seymour Bars, MD.   Why:  Voicemail left for Dr. Buena Irish in attempt to make follow-up appt. If he does not call by Monday afternoon, please call his office to schedule hospital follow-up/medication management appt. Thank you!   Contact information:   [address] [place], [place] [address] Phone: [phone] Fax: [phone]      Follow up with Patient refused referral for outpatient therapy and SA IOP. She plans to continue with AA meetings.     Follow-up recommendations:  Activity:  As tolerated Diet: As recommended by your primary care doctor. Keep all scheduled follow-up appointments as recommended.  Comments:  Take all your medications as prescribed by your mental healthcare provider. Report any adverse effects and or reactions from your medicines to your outpatient provider promptly. Patient is instructed and cautioned to not engage in alcohol and or illegal drug use while on prescription medicines. In the event of  worsening symptoms, patient is instructed to call the crisis hotline, 911 and or go to the nearest ED for appropriate evaluation and treatment of symptoms. Follow-up with your primary care provider for your other medical issues, concerns and or health care needs.   Total Discharge Time: Greater than 30 minutes  Signed: Encarnacion Slates, PMHNP-BC 01/12/2015, 1:27 PM  I personally assessed the patient and formulated the plan Geralyn Flash A. Sabra Heck, M.D.

## 2015-01-12 NOTE — ED Provider Notes (Signed)
CSN: [id number]     Arrival date & time 01/12/15  2157 History   First MD Initiated Contact with Patient 01/12/15 2232     Chief Complaint  Patient presents with  . Alcohol Intoxication     (Consider location/radiation/quality/duration/timing/severity/associated sxs/prior Treatment) HPI Comments: Jessica [name] is a 44 y.o. female with a PMHx of anxiety, depression, bipolar 1, and alcohol abuse, who presents to the ED accompanied by her father, with complaints of alcohol intoxication. Patient is very intoxicated and provides none of the history, but her father provides some answers. He states that she just left Behavioral Health this afternoon for alcohol abuse and depression, and he left her at home while he went to church, and upon returning from church he found her extremely intoxicated. She stated to him at that time that she had consumed 5 glasses of wine, but he didn't find any bottles in her room. He received a text message from the pt's best friend that stated she was "suicidal" and "needed help". This was sent prior to her becoming intoxicated. The pt states she drank "a lot" of wine, states she had 10 bottles but unclear exactly what was consumed since she is heavily intoxicated. She denies IVDU, HI, or AVH. She denied SI to me, but again the father has the text message from her phone that establishes that she felt suicidal today. According to the father, the pt drinks in order to "make the tremor in her fingers go away." He isn't aware of any hx of DTs or alcohol withdrawal seizures. When asked, she denies any medical complaints, but again she is very heavily intoxicated.  Patient is a 44 y.o. female presenting with intoxication. The history is provided by the patient and a parent. No language interpreter was used.  Alcohol Intoxication This is a chronic problem. The current episode started today. The problem occurs constantly. The problem has been unchanged. The symptoms are aggravated  by drinking. She has tried nothing for the symptoms. The treatment provided no relief.    Past Medical History  Diagnosis Date  . Anxiety   . Bipolar 1 disorder    History reviewed. No pertinent past surgical history. History reviewed. No pertinent family history. History  Substance Use Topics  . Smoking status: Current Every Day Smoker    Types: Cigarettes  . Smokeless tobacco: Not on file  . Alcohol Use: Yes   OB History    No data available      LEVEL 5 CAVEAT: PT INTOXICATED, PROVIDES NO HISTORY Review of Systems  Unable to perform ROS: Other   10 Systems reviewed and are negative for acute change except as noted in the HPI.    Allergies  Review of patient's allergies indicates no known allergies.  Home Medications   Prior to Admission medications   Medication Sig Start Date End Date Taking? Authorizing Provider  benztropine (COGENTIN) 1 MG tablet Take 1 tablet (1 mg total) by mouth 2 (two) times daily as needed for tremors. 01/12/15   Encarnacion Slates, NP  busPIRone (BUSPAR) 5 MG tablet Take 1 tablet (5 mg total) by mouth 3 (three) times daily. For anxiety 01/12/15   Encarnacion Slates, NP  divalproex (DEPAKOTE) 500 MG DR tablet Take 1 tablet (500 mg total) by mouth 2 (two) times daily. For mood stabilization 01/12/15   Encarnacion Slates, NP  gabapentin (NEURONTIN) 400 MG capsule Take 1 capsule (400 mg) four times daily: For agitation 01/12/15   Encarnacion Slates, NP  hydrOXYzine (ATARAX/VISTARIL) 25 MG tablet Take 1 tablet (25 mg total) by mouth 3 (three) times daily as needed for anxiety (anxiety). 01/12/15   Encarnacion Slates, NP  lamoTRIgine (LAMICTAL) 25 MG tablet Take 1 tablet (25 mg total) by mouth every other day. For mood stabilization 01/12/15   Encarnacion Slates, NP  mirtazapine (REMERON) 30 MG tablet Take 1 tablet (30 mg total) by mouth at bedtime. For depression/sleep 01/12/15   Encarnacion Slates, NP   BP 104/52 mmHg  Pulse 90  Temp(Src) 98.7 F (37.1 C) (Oral)  Resp 18  SpO2 98%   LMP  (LMP Unknown)   Physical Exam  Constitutional: Vital signs are normal. She appears well-developed and well-nourished.  Non-toxic appearance. No distress.  Afebrile nontoxic NAD, very intoxicated, sleeping but easily aroused  HENT:  Head: Normocephalic and atraumatic.  Mouth/Throat: Oropharynx is clear and moist and mucous membranes are normal.  Eyes: Conjunctivae are normal. Pupils are equal, round, and reactive to light. Right eye exhibits no discharge. Left eye exhibits no discharge.  Pt doesn't follow EOM exam due to intoxication PERRL  Neck: Normal range of motion. Neck supple.  Cardiovascular: Normal rate, regular rhythm, normal heart sounds and intact distal pulses.  Exam reveals no gallop and no friction rub.   No murmur heard. Pulmonary/Chest: Effort normal and breath sounds normal. No respiratory distress. She has no decreased breath sounds. She has no wheezes. She has no rhonchi. She has no rales.  Abdominal: Soft. Normal appearance and bowel sounds are normal. She exhibits no distension. There is no tenderness. There is no rigidity, no rebound, no guarding, no CVA tenderness, no tenderness at McBurney's point and negative Murphy's sign.  Musculoskeletal: Normal range of motion.  MAE x4 Unable to assess strength, sensation, or gait due to intoxication at this time  Neurological: She is alert.  Alert once awoken, but very intoxicated. No focal neuro deficits noted but pt unable to perform all exam findings due to intoxication  Skin: Skin is warm, dry and intact. No rash noted.  Psychiatric: Her speech is slurred.  Very slurred speech, very intoxicated  Nursing note and vitals reviewed.   ED Course  Procedures (including critical care time) Labs Review Labs Reviewed  CBC WITH DIFFERENTIAL/PLATELET - Abnormal; Notable for the following:    RBC 3.48 (*)    Hemoglobin 11.1 (*)    HCT 33.6 (*)    RDW 15.6 (*)    All other components within normal limits  COMPREHENSIVE  METABOLIC PANEL - Abnormal; Notable for the following:    Creatinine, Ser 0.48 (*)    Calcium 8.1 (*)    Total Protein 5.9 (*)    All other components within normal limits  ETHANOL - Abnormal; Notable for the following:    Alcohol, Ethyl (B) 363 (*)    All other components within normal limits  ACETAMINOPHEN LEVEL - Abnormal; Notable for the following:    Acetaminophen (Tylenol), Serum <10.0 (*)    All other components within normal limits  SALICYLATE LEVEL  URINE RAPID DRUG SCREEN (HOSP PERFORMED)    Imaging Review No results found.   EKG Interpretation None      MDM   Final diagnoses:  Alcohol intoxication, uncomplicated  Suicidal ideation    44 y.o. female here with alcohol intoxication. Just discharged from behavioral health for alcohol/suicidal ideations. Pt very intoxicated and unable to give any history. Father provides some history, and shows me a test message from pt that states she  was feeling suicidal earlier. It seems that her father left her at home earlier when he went to church and returned to find her intoxicated. He denies hx of DTs/seizures with withdrawals but states she does get tremors. Given that her father has proof of her suicidality, will get clearance labs but await for pt to get clinically sober until TTS is called. Will reassess shortly. CIWA protocol in place.   1:02 AM PT still very intoxicated. CBC w/diff showing slight anemia, which is new compared to 2 days ago but unclear if this is chronic issue or acute change. Pt doesn't provide history of any bleeding, therefore could be her baseline. CMP WNL. EtOH 363. Salicylate and tylenol level WNL. UDS pending. Pt still very intoxicated. Will need to sober up before TTS is consulted for evaluation. Will sign care over to Junius Creamer NP at shift change, please see her note for further documentation of care.   Patty Sermons [place], PA-C 01/13/15 0106  Dorie Rank, MD 01/15/15 [phone]

## 2015-01-12 NOTE — Progress Notes (Signed)
Patient ID: Jessica [name], female   DOB: 07-May-1971, 44 y.o.   MRN: [id number]   Pt was discharged home with her dad, all discharge instruction were given to patient and dad. Pt's dad reported that he needed help in finding his daughter a new doctor, and needed a referral to the [facility] for his daughters ticks. This Probation officer spoke to Dr. Sabra Heck regarding the referrals, Dr. Sabra Heck instructed this writer to get paperwork from dad and that he would make referral on Monday. Copy of paperwork given to Dr. Sabra Heck. Pt was given samples and prescriptions. No other issues or concerns noted.

## 2015-01-12 NOTE — Progress Notes (Signed)
Attended group 

## 2015-01-13 LAB — CBC WITH DIFFERENTIAL/PLATELET
BASOS ABS: 0.1 10*3/uL (ref 0.0–0.1)
BASOS PCT: 1 % (ref 0–1)
EOS ABS: 0.1 10*3/uL (ref 0.0–0.7)
Eosinophils Relative: 1 % (ref 0–5)
HCT: 33.6 % — ABNORMAL LOW (ref 36.0–46.0)
Hemoglobin: 11.1 g/dL — ABNORMAL LOW (ref 12.0–15.0)
Lymphocytes Relative: 38 % (ref 12–46)
Lymphs Abs: 3.1 10*3/uL (ref 0.7–4.0)
MCH: 31.9 pg (ref 26.0–34.0)
MCHC: 33 g/dL (ref 30.0–36.0)
MCV: 96.6 fL (ref 78.0–100.0)
Monocytes Absolute: 0.4 10*3/uL (ref 0.1–1.0)
Monocytes Relative: 5 % (ref 3–12)
Neutro Abs: 4.5 10*3/uL (ref 1.7–7.7)
Neutrophils Relative %: 55 % (ref 43–77)
Platelets: 215 10*3/uL (ref 150–400)
RBC: 3.48 MIL/uL — AB (ref 3.87–5.11)
RDW: 15.6 % — ABNORMAL HIGH (ref 11.5–15.5)
WBC: 8.1 10*3/uL (ref 4.0–10.5)

## 2015-01-13 LAB — COMPREHENSIVE METABOLIC PANEL
ALT: 20 U/L (ref 0–35)
AST: 12 U/L (ref 0–37)
Albumin: 3.5 g/dL (ref 3.5–5.2)
Alkaline Phosphatase: 43 U/L (ref 39–117)
Anion gap: 7 (ref 5–15)
BILIRUBIN TOTAL: 0.3 mg/dL (ref 0.3–1.2)
BUN: 7 mg/dL (ref 6–23)
CALCIUM: 8.1 mg/dL — AB (ref 8.4–10.5)
CHLORIDE: 108 mmol/L (ref 96–112)
CO2: 26 mmol/L (ref 19–32)
Creatinine, Ser: 0.48 mg/dL — ABNORMAL LOW (ref 0.50–1.10)
GFR calc Af Amer: >90 mL/min (ref >90)
GFR calc non Af Amer: >90 mL/min (ref >90)
GLUCOSE: 84 mg/dL (ref 70–99)
POTASSIUM: 3.8 mmol/L (ref 3.5–5.1)
Sodium: 141 mmol/L (ref 135–145)
Total Protein: 5.9 g/dL — ABNORMAL LOW (ref 6.0–8.3)

## 2015-01-13 LAB — I-STAT CHEM 8, ED
CHLORIDE: 113 mmol/L — AB (ref 96–112)
Calcium, Ion: 1 mmol/L — ABNORMAL LOW (ref 1.12–1.23)
Creatinine, Ser: 0.8 mg/dL (ref 0.50–1.10)
GLUCOSE: 75 mg/dL (ref 70–99)
HCT: 36 % (ref 36.0–46.0)
Hemoglobin: 12.2 g/dL (ref 12.0–15.0)
POTASSIUM: 4.3 mmol/L (ref 3.5–5.1)
SODIUM: 148 mmol/L — AB (ref 135–145)
TCO2: 21 mmol/L (ref 0–100)

## 2015-01-13 LAB — RAPID URINE DRUG SCREEN, HOSP PERFORMED
Amphetamines: NOT DETECTED
BARBITURATES: POSITIVE — AB
BENZODIAZEPINES: POSITIVE — AB
Cocaine: NOT DETECTED
Opiates: NOT DETECTED
Tetrahydrocannabinol: NOT DETECTED

## 2015-01-13 LAB — SALICYLATE LEVEL: Salicylate Lvl: <4 mg/dL (ref 2.8–20.0)

## 2015-01-13 LAB — ETHANOL: ALCOHOL ETHYL (B): 363 mg/dL — AB (ref 0–9)

## 2015-01-13 LAB — I-STAT CG4 LACTIC ACID, ED: LACTIC ACID, VENOUS: 1.11 mmol/L (ref 0.5–2.0)

## 2015-01-13 LAB — ACETAMINOPHEN LEVEL: Acetaminophen (Tylenol), Serum: <10 ug/mL — ABNORMAL LOW (ref 10–30)

## 2015-01-13 MED ORDER — SODIUM CHLORIDE 0.9 % IV BOLUS (SEPSIS)
1000.0000 mL | Freq: Once | INTRAVENOUS | Status: AC
  Administered 2015-01-13: 1000 mL via INTRAVENOUS

## 2015-01-13 NOTE — ED Notes (Signed)
BP on monitor noted Manual BP obtained for accuracy  Manual BP 82/60 Dr. Kathrynn Humble made aware

## 2015-01-13 NOTE — ED Notes (Signed)
Pt ambulatory and request to call cab to leave. MD at bedside aware and states safe for pt to go home via cab. Pt stable.

## 2015-01-13 NOTE — ED Notes (Signed)
Patient continues to be hypotensive Bolus infusing Neuro status remains unchanged Patient denies complaints Will continue to monitor closely

## 2015-01-13 NOTE — Discharge Instructions (Signed)
Go to detox.   Stay hydrated.   Follow up with a psychiatrist.   Return to ER if you have thoughts of harming yourself or others, intoxicated.    Emergency Department Resource Guide 1) Find a Doctor and Pay Out of Pocket Although you won't have to find out who is covered by your insurance plan, it is a good idea to ask around and get recommendations. You will then need to call the office and see if the doctor you have chosen will accept you as a new patient and what types of options they offer for patients who are self-pay. Some doctors offer discounts or will set up payment plans for their patients who do not have insurance, but you will need to ask so you aren't surprised when you get to your appointment.  2) Contact Your Local Health Department Not all health departments have doctors that can see patients for sick visits, but many do, so it is worth a call to see if yours does. If you don't know where your local health department is, you can check in your phone book. The CDC also has a tool to help you locate your state's health department, and many state websites also have listings of all of their local health departments.  3) Find a [facility] If your illness is not likely to be very severe or complicated, you may want to try a walk in clinic. These are popping up all over the country in pharmacies, drugstores, and shopping centers. They're usually staffed by nurse practitioners or physician assistants that have been trained to treat common illnesses and complaints. They're usually fairly quick and inexpensive. However, if you have serious medical issues or chronic medical problems, these are probably not your best option.  No Primary Care Doctor: - Call Health Connect at  [phone] - they can help you locate a primary care doctor that  accepts your insurance, provides certain services, etc. - Physician Referral Service- [phone]  Chronic Pain Problems: Organization          Address  Phone   Notes  [facility]  [phone] Patients need to be referred by their primary care doctor.   Medication Assistance: Organization         Address  Phone   Notes  [facility] Medication [facility] [address] [place], [place] [address] [phone] --Must be a resident of [facility] -- Must have NO insurance coverage whatsoever (no Medicaid/ Medicare, etc.) -- The pt. MUST have a primary care doctor that directs their care regularly and follows them in the community   MedAssist  [phone]   [employer]  [phone]    Agencies that provide inexpensive medical care: Organization         Address  Phone   Notes  [place]  [phone]   [facility]    [phone]   [facility] [place], [place] [address] [phone]   [place] [address] [place] [phone]   Planned Parenthood    [phone]   [facility]    [phone]   [place] and [place] [address], [place] Phone:  [phone], Fax:  [phone] Hours of Operation:  9 am - 6 pm, M-F.  Also accepts Medicaid/Medicare and self-pay.  [facility] for [place]  [address], [address], [place] Phone: [phone], Fax: [phone]. Hours of Operation:  8:30 am - 5:30 pm, M-F.  Also accepts Medicaid and self-pay.  [facility] [place] [address], [place] Phone: [phone]   [place], [place], [place] [phone], Ext. [phone] Mondays & Thursdays: 7-9 AM.  First 15 patients are seen on a first come, first serve basis.    [place] Providers:  Organization         Address  Phone   Notes  [facility] [address], [place] [phone] Also accepts self-pay patients.  [facility] [address], [place]  [phone]   [place], [address], [place] [phone]   [facility] [address] [place] [phone]   Lucianne Lei [address], [place]   [phone] Only accepts [place] Access [place] patients after they have their name applied to their card.   Self-Pay (no insurance) in [facility]:  Organization         Address  Phone   Notes  Sickle Cell Patients, [facility] Internal Medicine [place] [phone]   [facility] [place] [phone]   [facility] [place]  [place], [place], [place] [phone]   [facility]/Dr. Osei-Bonsu  [address] [place] or [address], [place] [phone] Phone number for both [place] and [place] locations is the same.  Urgent Medical [facility] [address] [place] [phone]   [facility] [address], [place] or [address] [phone]   [facility] [address], [place] [phone], phone; [phone], fax Sees patients 1st and 3rd Saturday of every month.  Must not qualify for public or private insurance (i.e. Medicaid, Medicare, [place] Health Choice, Veterans' Benefits)  Household income should be no more than 200% of the poverty level The clinic cannot treat you if you are pregnant or think you are pregnant  Sexually transmitted diseases are not treated at the clinic.    Dental Care: Organization         Address  Phone  Notes  [facility] Department of [facility] [place] [phone] Accepts children up to age 65 who are enrolled in [place] or [place]; pregnant women with a Medicaid card; and  children who have applied for Medicaid or [place] Health Choice, but were declined, whose parents can pay a reduced fee at time of service.  [facility] Department of [facility]  [address], [place] [phone] Accepts children up to age 3 who are enrolled in [place] or [place]; pregnant women with a Medicaid card; and children who have applied for Medicaid or [place] Health Choice, but were declined, whose parents can pay a reduced fee at time of service.  [place] [facility] Access PROGRAM  [place] [phone] Patients are seen by appointment only. Walk-ins are not accepted. [place] will see patients 62 years of age and older. Monday - Tuesday (8am-5pm) Most Wednesdays (8:30-5pm) $30 per visit, cash only  [place]  PROGRAM  [address], [facility] [phone] Patients are seen by appointment only. Walk-ins are not accepted. [place] will see patients 97 years of age and older. One Wednesday Evening (Monthly: Volunteer Based).  $30 per visit, cash only  [place]  [phone] for adults; Children under age 56, call Graduate Pediatric Dentistry at [phone]. Children aged 62-14, please call [phone] to request a pediatric application.  Dental services are provided in all areas of dental care including fillings, crowns and bridges, complete and partial dentures, implants, gum treatment, root canals, and extractions. Preventive care is also provided. Treatment is provided to both adults and children. Patients are selected via a lottery and there is often a waiting list.   [facility] [address] [place]  [phone] [email or web address]   [facility] [address] [place], [place] [phone], Ext. [phone] Second and Fourth Thursday of each month, opens at 6:30 AM; Clinic ends at 9 AM.  Patients are seen on a first-come first-served  basis, and a limited number are seen during each clinic.   [facility]  [address] Hillard Danker [place], [place] [phone]   Eligibility Requirements You must have lived in [place], [place], or [place] counties for at least the last three months.   You cannot be eligible for state or federal sponsored [employer], including [employer], [place], or [employer].   You generally cannot be eligible for healthcare insurance through your employer.    How to apply: Eligibility screenings are held every Tuesday and Wednesday afternoon from 1:00 pm until 4:00 pm. You do not need an appointment for the interview!  [facility] [address], [place], [place]   [place]  [place] Department  [place]  [phone]    Behavioral Health Resources in the Community: Intensive Outpatient Programs Organization         Address  Phone  Notes  [facility] [place]. [address] [place], [place] [phone]   [facility] Outpatient [address] [place], [place]   ADS: Alcohol & Drug Svcs [address] [place], [place]   [place] [address],  [employer], [place] or [phone]   Substance Abuse Resources Organization         Address  Phone  Notes  Alcohol and Drug Services  [phone]   [place]  [phone]   [facility] [place]   Chinita Pester  [phone]   Residential & Outpatient Substance Abuse Program  [phone]   Psychological Services Organization         Address  Phone  Notes  [facility] [place]  [place]  [phone]   [place] [address], [place] or [phone]    Mobile Crisis Teams Organization          Address  Phone  Notes  [facility]atives, Mobile Crisis Care Unit  [phone]   [facility]  [address] [place], [place]   [place] [address], [place] [phone]    Self-Help/Support Groups Organization         Address  Phone             Notes  [place]  of [place] - variety of support groups  [place] Call for more information  Narcotics Anonymous (NA), [facility] [address], [employer] [place]  2 meetings at this location   Special educational needs teacher         Address  Phone  Notes  ASAP Residential Treatment [place],    [place]  [phone]   [facility]  [address], [place] [address], [place], [place]   [place], [place] [phone] Admissions: 8am-3pm M-F  Incentives Substance [place] [address]    [place], [place] [phone]   [facility] [address] [place], [place], [place]   [facility] [address],  [place], [place]   [facility] Programs - Intensive Outpatient [address], [place], [place]   [facility] ([place] [address]  [place], [place] [phone] or [phone]   [facility] Services (RTS) [address], [place], [place] Accepts Medicaid  [facility] [place] [address],  [place] [phone] Substance Abuse/Addiction Treatment   [facility] Organization         Address  Phone  Notes  [facility]  [phone]   Domenic Schwab, PhD [address] Arlis Porta [place], [place]   [phone] or [phone]   [facility] [place], [place] [phone]   [facility] [address], [place], [place] [phone] Insurance/Medicaid/sponsorship  through [facility] and Families [address]                                    [place], [place] [phone] [facility] [address], [place] [phone]    Dr. Adele Schilder  [phone]   [facility] Dept. 1) [address], [place] 2) [place] 3)  [place] [address], [place] [phone]  [phone]   [place] [phone] or [phone] (After Hours)

## 2015-01-13 NOTE — ED Notes (Signed)
Assisted patient to the bedside commode. Pt is unsteady on her feet. Ysidro Evert, NT assisted myself with using the bedside commode. Pt urinated a large amount.

## 2015-01-13 NOTE — ED Provider Notes (Addendum)
  Physical Exam  BP 94/60 mmHg  Pulse 98  Temp(Src) 97.5 F (36.4 C) (Oral)  Resp 18  SpO2 98%  LMP  (LMP Unknown)  Physical Exam  ED Course  Procedures  MDM Patient reassessed this AM. Patient texted a friend last night and told her that she wanted to kill herself. But she was intoxicated at the time. Right now, she is sober. No tremors. She actively denies suicidal ideation. Has steady gait. Doesn't want detox. BP slightly low but always low. Na 148 this AM, likely from dehydration. Will dc home.   Wandra Arthurs, MD 01/13/15 0263  Wandra Arthurs, MD 01/13/15 [phone]

## 2015-01-13 NOTE — ED Notes (Signed)
Pt called father to let him know going home via taxi.

## 2015-01-13 NOTE — ED Notes (Signed)
Upon initial contact with pt, pt denies SI/HI but reports "I am an alcoholic."

## 2015-01-13 NOTE — ED Notes (Signed)
2nd NS bolus completed BP has not improved Patient in NAD  Will make Dr. Kathrynn Humble aware

## 2015-01-13 NOTE — ED Notes (Signed)
Patient unable to recall events prior to being moved to Res B Patient is able to report that she was recently discharged from [facility], that her parents brought her to ED for help with ETOH abuse and that she has had frequent falls at home due to her ETOH abuse

## 2015-01-13 NOTE — ED Notes (Signed)
Bob/father will be here to pick up pt in an hour.

## 2015-01-13 NOTE — ED Provider Notes (Signed)
Pt in the ER for alcohol abuse. At around 4 am i was informed that pt had fell in her room. Pt is aox3, has no complains, she has ambulated, quick skeletal survey andexam reveals no gross deformity or tenderness. Pt was moved to ROOM A.  She was then noted to have slgihtly low BP. Pt states that her BP always low. Rechecked pt again. She is still aox3. Pt will be legally sober at 11 am. Her thought process is not completely normal, so doesn't appear to be clinically sober. She had received 2 mg iv and 2 mg oral ativan between 12 and 3 am. No need for CT. Fluids started. I suspect that pt has low BP at baseline, and the sleep and 4 mg ativan are contributing. Will monitor closely. istat lactate and chem 8 ordered after 2 liters of ivf given to the patient, as her BP stayed in the MAP of 60s, and is normal.  She is clinically not sober yet and will need reassessment when she is sober.   Varney Biles, MD 01/13/15 [phone]

## 2015-01-13 NOTE — ED Notes (Signed)
Pt ambulatory independently to restroom to void.

## 2015-01-13 NOTE — ED Notes (Signed)
Lorazepam tablet not given, Lorazepam IV sedated patient.

## 2015-01-13 NOTE — ED Notes (Addendum)
Patient able to ambulate to bathroom with stand by assistance from nursing staff to assess gait Gait steady and without difficulty Dr. Kathrynn Humble made aware

## 2015-01-13 NOTE — ED Notes (Signed)
NS bolus completed, see MAR Patient remains hypotensive Additional NS bolus ordered

## 2015-01-13 NOTE — ED Notes (Signed)
MD at bedside. 

## 2015-01-13 NOTE — ED Notes (Signed)
Patient sat up in bed, pulled out PIV site and stated, "I am leaving." Patient then asked nursing staff, "How did I get here? Who brought me?" Patient has previously told nursing staff and ED providers that her parents brought her in to ED  Patient reoriented and informed that she is not to get off of stretcher without asking for assistance from nursing staff

## 2015-01-13 NOTE — ED Notes (Signed)
BP noted Patient resting with eyes closed, but will respond and awaken to both verbal and tactile stimuli NS 1000 ml bolus given due to ETOH intoxication, + UDS and admin of multiple doses of Ativan (See MAR) Dr. Kathrynn Humble made aware

## 2015-01-13 NOTE — ED Notes (Signed)
Dr. Kathrynn Humble at bedside and is aware that BP has not improved after 2nd NS bolus Per MD, no additional IV fluids to be ordered and given at this time Per MD, patient to be monitored closely  Per MD, if SBP < 80 mm Hg patient may need to be admitted

## 2015-01-13 NOTE — ED Notes (Signed)
During assessment upon arrival to Res B, patient denied SI/HI to this nurse Upon review of notes from earlier during this ED visit, it is noted that patient's father had received a text message from the patient's friend indicating that patient was suicidal Will make EDP aware and monitor patient status closely

## 2015-01-13 NOTE — ED Notes (Signed)
Pt sleeping, difficult to arouse.

## 2015-01-13 NOTE — ED Notes (Signed)
Patient moved to Res B due to high fall risk, ETOH level and poor judgement r/t safety Patient noted to have large amount of ecchymosis in various stages of healing to left upper thigh and hip  Patient states that she "fell while drinking at [employer] several days ago" and that's how bruising occurred to left leg Patient denies any c/o pain Patient informed that she is not to get up off of stretcher without asking for assistance

## 2015-01-16 ENCOUNTER — Encounter (HOSPITAL_COMMUNITY): Payer: Self-pay | Admitting: Psychology

## 2015-01-16 NOTE — Progress Notes (Signed)
Patient Discharge Instructions:  After Visit Summary (AVS):   Faxed to:  01/16/15 Discharge Summary Note:   Faxed to:  01/16/15 Psychiatric Admission Assessment Note:   Faxed to:  01/16/15 Suicide Risk Assessment - Discharge Assessment:   Faxed to:  01/16/15 Faxed/Sent to the Next Level Care provider:  01/16/15 Faxed to Dr. Riki Rusk, MD @ [place], 01/16/2015, 1:41 PM

## 2015-01-20 ENCOUNTER — Other Ambulatory Visit (HOSPITAL_COMMUNITY): Payer: 59 | Admitting: Psychology

## 2015-01-20 DIAGNOSIS — F1320 Sedative, hypnotic or anxiolytic dependence, uncomplicated: Secondary | ICD-10-CM | POA: Insufficient documentation

## 2015-01-20 DIAGNOSIS — F10239 Alcohol dependence with withdrawal, unspecified: Secondary | ICD-10-CM | POA: Insufficient documentation

## 2015-01-20 DIAGNOSIS — F17210 Nicotine dependence, cigarettes, uncomplicated: Secondary | ICD-10-CM | POA: Insufficient documentation

## 2015-01-20 DIAGNOSIS — F319 Bipolar disorder, unspecified: Secondary | ICD-10-CM | POA: Insufficient documentation

## 2015-01-20 DIAGNOSIS — F410 Panic disorder [episodic paroxysmal anxiety] without agoraphobia: Secondary | ICD-10-CM | POA: Insufficient documentation

## 2015-01-20 DIAGNOSIS — Z599 Problem related to housing and economic circumstances, unspecified: Secondary | ICD-10-CM | POA: Insufficient documentation

## 2015-01-21 ENCOUNTER — Encounter (HOSPITAL_COMMUNITY): Payer: Self-pay | Admitting: Psychology

## 2015-01-21 NOTE — Progress Notes (Signed)
Jessica [name] is a 44 y.o. female patient. Orientation to CD-IOP: The patient is a single, Caucasian, female seeking entry into the CD-IOP to address her alcohol dependence. The patient lives with her parents in [place], [place] and was accompanied by her father for this orientation session. The patient reported she has been drinking since she was 44 yo. She was initially a beer drinker, but over the past few years, her drinking has increased and her daily intake has included a pint of vodka followed by a number of beers or wine. She has been living in [place], [place] and working as a Educational psychologist. She called her parents and returned to [facility] in October of this past year after breaking up with her boyfriend and losing her job.  The patient reported she had been in a medical detox for alcohol on 2 occasions. She also attended a CD-IOP in [place] and stayed sober 15 months after that. The patient reported she had been active in [place] while sober, but gradually stopped working the program and started drinking again. Once she was back in [place], the patient continued to drink, but kept it hidden from her parents. Her drinking increased and they suddenly recognized the problem. The patient agreed to enter [facility], but was discharged on January 21rst after 14 days of treatment because her insurance refused to authorize any more days. Upon returning to [facility] and her parents' home, the patient resumed drinking and ended up at [facility]. She completed an alcohol detox and was discharged on Sunday, January 31st, but returned to [facility] ED very intoxicated the next day. The patient admitted she had also abused Klonipin for a number of years while living in [place]. She had used cannabis, but stopped smoking because she became paranoid and anxious. Approximately 4 years ago the patient was diagnosed with Bipolar Disorder while enrolled in the dual diagnosis outpatient program at [facility] in [place], [place].  She was prescribed a number of medications since then, but her compliance seems unlikely while drinking heavily. The patient is currently prescribed Cogentin, Buspar, Depakote, Neurontin, Vistaril, Lamictal, and Remeron. She reported that while upstairs in detox, Dr. Sabra Heck had halted the Saphras she was prescribed by Dr. Seymour Bars, a psychiatrist whom she had worked with in [place], [place]. The patient was very anxious during the orientation and expressed concerns about tremors that she was experiencing in her face and hands. As the orientation progressed, the patient admitted she was feeling more relaxed. The patient reported she was scheduled to meet with her sponsor and attend an [place] meeting tomorrow. As a result, she would not begin the program until Monday. The fact that she has a sponsor and is attending [place] meetings was applauded. The documentation was reviewed, signed and completed accordingly.  The patient will return on Monday, February 8th and begin the CD-IOP.         Abigail Marsiglia, LCAS

## 2015-01-21 NOTE — Progress Notes (Signed)
    Daily Group Progress Note  Program: CD-IOP   Group Time: 1-2:30 pm  Participation Level: Active  Behavioral Response: Appropriate and Sharing  Type of Therapy: Process Group  Topic: Process; the first part of group was spent process. Members shared about the past weekend and any issues or concerns that may have presented themselves. They also identified things they did that supported their early recovery. Also present today were 2 new group members. During this half of group they both introduced themselves and shared a little bit about what had brought them here.   Group Time: 2:45- 4pm  Participation Level: Minimal  Behavioral Response: Appropriate  Type of Therapy: Psycho-education Group  Topic: Psycho-Ed: the second half of group was largely spent watching the film, "My Name was Bette". The film was about a woman, named Sport and exercise psychologist, who had lost herself to alcoholism and eventually died from it. Her two daughters were the primary speakers and they recounted their mother's deterioration and subsequent death. The film also focused on the ways that alcohol damages a woman's body more quickly than a man's. There was a short time to discuss the film and almost every group member found it both powerful and painful.    Summary: The patient was new to the group and admitted to her new group members that she was very anxious about being here. She received immediate support and feedback and was assured that everyone was nervous on their first day. Later in this part of group, the patient shared more about her life and her alcohol and drug use. She was very concerned with her tremors and wondered if she had damaged her brain as a result of her drinking? Another young woman shared that she had felt the same way when she first got sober, but now, she had picked up her 60 day chip. I had met her last Thursday, but she had not been able to appear on Friday due to a previous commitment. Today she  reported she had attended an [place] meeting everyday over the weekend with her sponsor. The group applauded this news. In the second half of group, the patient found the film very painful and upsetting. She admitted she couldn't watch much of it and had looked away. The patient responded quite well to this first group session despite her reluctance and shared openly about her life. Her sobriety date is 2/7.   Family Program: Family present? No   Name of family member(s):   UDS collected: No Results:  AA/NA attended?: YesFriday, Saturday and Sunday  Sponsor?: Yes   Sima Lindenberger, LCAS

## 2015-01-22 ENCOUNTER — Other Ambulatory Visit (HOSPITAL_COMMUNITY): Payer: 59 | Attending: Psychiatry | Admitting: Psychology

## 2015-01-22 DIAGNOSIS — Z599 Problem related to housing and economic circumstances, unspecified: Secondary | ICD-10-CM | POA: Diagnosis not present

## 2015-01-22 DIAGNOSIS — F319 Bipolar disorder, unspecified: Secondary | ICD-10-CM | POA: Diagnosis not present

## 2015-01-22 DIAGNOSIS — F3130 Bipolar disorder, current episode depressed, mild or moderate severity, unspecified: Secondary | ICD-10-CM

## 2015-01-22 DIAGNOSIS — F10239 Alcohol dependence with withdrawal, unspecified: Secondary | ICD-10-CM | POA: Diagnosis not present

## 2015-01-22 DIAGNOSIS — F1320 Sedative, hypnotic or anxiolytic dependence, uncomplicated: Secondary | ICD-10-CM | POA: Diagnosis not present

## 2015-01-22 DIAGNOSIS — F1020 Alcohol dependence, uncomplicated: Secondary | ICD-10-CM

## 2015-01-22 DIAGNOSIS — F17210 Nicotine dependence, cigarettes, uncomplicated: Secondary | ICD-10-CM | POA: Diagnosis not present

## 2015-01-22 DIAGNOSIS — F410 Panic disorder [episodic paroxysmal anxiety] without agoraphobia: Secondary | ICD-10-CM | POA: Diagnosis not present

## 2015-01-23 ENCOUNTER — Encounter (HOSPITAL_COMMUNITY): Payer: Self-pay | Admitting: Psychology

## 2015-01-23 NOTE — Progress Notes (Signed)
    Daily Group Progress Note  Program: CD-IOP   Group Time: 1-2:30 pm  Participation Level: Active  Behavioral Response: Sharing  Type of Therapy: Process Group  Topic: Process/Psychoeducation:  After checking in, group members took turns sharing about any recovery related activities they engaged in since the last group meeting, as well as any challenges they faced.  Also during this time, a chaplain visited the group to talk about spirituality and communication.  A role-play was conducted to model assertive communication, and group members were able to provide feedback regarding what they noticed.  Group Time: 2:45- 4pm  Participation Level: Active  Behavioral Response: Sharing  Type of Therapy: Process  Topic: Process:  During the second portion of group, members who did not get to share before the chaplain came, shared about their recovery related activities and challenges they faced since the last group meeting.  There was one new group member today and he took the time to introduce himself to the group and share some of his story.  Drug tests were collected today from all group members.  Summary: The patient reported that she attended a meeting with her sponsor on both Monday and Tuesday evenings, however, she had a panic attack while sharing at the meeting due to her nerves.  She also attended vocational rehabilitation on Tuesday to receive assistance in finding a job.  The patient met with her psychiatrist and stated that she is experiencing symptoms due to a change in her medications.  She told the group that her parents took her car keys because she kept driving to the liquor store, but she thinks she will get them back tomorrow.  The patient became emotional when discussing her fear of relapsing again. The group was supportive and provided examples of what they have done in the past, when they are craving and struggling with wanting to get high or drink. She was attentive and  expressed appreciation to some of the suggestions from her fellow group members. The patie was active in group discussion and her sobriety date is 2/7.  Family Program: Family present? No   Name of family member(s):   UDS collected: Yes Results: not back yet.  AA/NA attended?: [place] and Tuesday  Sponsor?: Yes   Crimson [employer], LCAS

## 2015-01-24 ENCOUNTER — Other Ambulatory Visit (HOSPITAL_COMMUNITY): Payer: 59

## 2015-01-24 ENCOUNTER — Emergency Department: Payer: Self-pay | Admitting: Emergency Medicine

## 2015-01-27 ENCOUNTER — Other Ambulatory Visit (HOSPITAL_COMMUNITY): Payer: 59

## 2015-01-29 ENCOUNTER — Other Ambulatory Visit (HOSPITAL_COMMUNITY): Payer: 59 | Admitting: Psychology

## 2015-01-29 DIAGNOSIS — F3130 Bipolar disorder, current episode depressed, mild or moderate severity, unspecified: Secondary | ICD-10-CM

## 2015-01-29 DIAGNOSIS — F10239 Alcohol dependence with withdrawal, unspecified: Secondary | ICD-10-CM | POA: Diagnosis not present

## 2015-01-29 DIAGNOSIS — F1020 Alcohol dependence, uncomplicated: Secondary | ICD-10-CM

## 2015-01-30 ENCOUNTER — Encounter (HOSPITAL_COMMUNITY): Payer: Self-pay | Admitting: Psychology

## 2015-01-30 NOTE — Progress Notes (Signed)
    Daily Group Progress Note  Program: CD-IOP   Group Time: 1-2:30 pm  Participation Level: Active  Behavioral Response: Appropriate and Sharing  Type of Therapy: Psycho-Education  Topic: Process/Psychoeducation:  After checking in, group members took turns sharing about recovery-related activities they engaged in, as well as challenges they faced, since our last group meeting.  Due to the snow day, group members shared about the previous five days since the group last met.  Also during this time, the pharmacist from the inpatient unit came to group to educate the patients on different medications.  Group members were active during this discussion and asked various questions.   Group Time: 2:45- 4pm  Participation Level: Minimal  Behavioral Response: Sharing  Type of Therapy: Process Group  Topic: Process:  During the second portion of group, the group members who did not get a chance to share before the pharmacist came in updated the group on recovery-related activities they engaged in, as well as challenges they faced, since the last group meeting.  One group member relapsed and was able to process with the group and receive encouragement from other group members.  Drug tests were collected today, and results of drug tests taken last week were returned.  Summary: The patient told the group that she had a meeting with her counselor on Friday because she had not been sleeping well and was having extreme anxiety.  She reported that she thought she had a hallucination and went to the hospital to be evaluated.  It was determined that she was okay to leave and was released that evening.  The patient stated that she went to a meeting on Saturday, but left early due to her anxiety.  She attended a meeting on both Sunday and Monday, and reported that she went to the mall to get some exercise on Tuesday.  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  During the psychoeducational portion of group, she actively asked questions, specifically related to anxiety management medications.  The patient was highly anxious today and stated that she was afraid she was going to have a panic attack.  The counselor gave her some strategies to relax herself. About 15 minutes before the end of group, the patient whispered to the counselor that she had to leave. She appeared somewhat anxious and reported she had to leave. It remains to be seen whether this patient is able satisfactorily address her needs in this group setting. Her sobriety date remains 2/7.   Family Program: Family present? No   Name of family member(s):   UDS collected: Yes Results: not back from lab  AA/NA attended?: YesSaturday  Sponsor?: Yes   Cole Klugh, LCAS

## 2015-01-31 ENCOUNTER — Other Ambulatory Visit (HOSPITAL_COMMUNITY): Payer: 59 | Admitting: Psychology

## 2015-01-31 DIAGNOSIS — F1020 Alcohol dependence, uncomplicated: Secondary | ICD-10-CM

## 2015-01-31 DIAGNOSIS — F10239 Alcohol dependence with withdrawal, unspecified: Secondary | ICD-10-CM | POA: Diagnosis not present

## 2015-01-31 DIAGNOSIS — F3130 Bipolar disorder, current episode depressed, mild or moderate severity, unspecified: Secondary | ICD-10-CM

## 2015-02-03 ENCOUNTER — Other Ambulatory Visit (HOSPITAL_COMMUNITY): Payer: 59 | Admitting: Licensed Clinical Social Worker

## 2015-02-03 ENCOUNTER — Encounter (HOSPITAL_COMMUNITY): Payer: Self-pay | Admitting: Psychology

## 2015-02-03 DIAGNOSIS — F10239 Alcohol dependence with withdrawal, unspecified: Secondary | ICD-10-CM | POA: Diagnosis not present

## 2015-02-03 DIAGNOSIS — F1020 Alcohol dependence, uncomplicated: Secondary | ICD-10-CM

## 2015-02-03 NOTE — Progress Notes (Signed)
    Daily Group Progress Note  Program: CD-IOP   Group Time: 1-2:30 pm  Participation Level: Active  Behavioral Response: Appropriate  Type of Therapy: Process Group  Topic: Group Process; the first part of group was spent in process. Members shared about their recovery and what they have done since the last session to maintain sobriety. A new group member was present and she introduced herself to the group and explained why she was here seeking treatment. During group today, the program director met with the new patient and 2 other members to discuss medications, etc. The results of drug tests collected on Wednesday were returned to group members.   Group Time: 2:45- 4pm  Participation Level: Active  Behavioral Response: Sharing  Type of Therapy: Psycho-education Group  Topic: Psycho-Education: Family Sculpture. The second half of group was spent doing Family Sculptures. Three members sculpted their families. The sculptures were very revealing and members spoke at length about them. The sculptures provide members with a different perspective e and history of each member as well as provide insight for the member who is sculpting his/her family. The session invited excellent feedback and sharing among members and the sculptures proved very insightful.   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 She reported she had not slept well and is really tired today. She apologized to the group for having left early on Wednesday and admitted she had been really anxious. Another member asked her about serving alcohol and whether that was a problem. The patient admitted it felt a little weird and she would have to be very careful. In the second half of group, the patient sculpted her family. After other members tried to interpret her sculpture and the different people in it, the patient explained that her  sculpture depicted her life around age 11. Her parents had divorced when she was just 8 yo and this sculpture showed her brother and herself with her father. The little girl was crying and the patient explained that she really missed her mother during those years and was very lonely. The patient' sculpture was very painful for her and she got tears in her eyes as she shared about her sadness. The patient also stood in for other sculptures and provided good input during the process. The patient presented much more stable and consistent than in previous group sessions. She responded well to this intervention and her sobriety date is 2/7.   Family Program: Family present? No   Name of family member(s):   UDS collected: No Results:   AA/NA attended?: No  Sponsor?: Yes   ,, LCAS        

## 2015-02-04 ENCOUNTER — Encounter (HOSPITAL_COMMUNITY): Payer: Self-pay | Admitting: Licensed Clinical Social Worker

## 2015-02-04 NOTE — Progress Notes (Signed)
    Daily Group Progress Note  Program: CD-IOP   Group Time: 1-2:30  Participation Level: Active  Behavioral Response: Appropriate and Sharing  Type of Therapy: Process Group  Topic: After checking in, group members took turns sharing about recovery-related activities and challenges they faced since our last group meeting.   Group members were active during this discussion and many group members engaged in feedback to other group members. Drug tests were given out to all group members.    Group Time: 2:45-4  Participation Level: Active  Behavioral Response: Appropriate and Sharing  Type of Therapy: Process Group  Topic: The second portion of the group involved family sculpting, an expressive technique, a nonverbal method, whereby a group member physically places his/her family members (portrayed by group members) in positions relational to one another - a three dimensional arrangement. This technique is an interpretation of the structure of the group member's family. All group members participated in the family structure or interpretation of the structure  Summary: The patient reported she has worked many hours since the last group. 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 The patient reported she has not been to any meetings since the last group due to her hectic schedule at work, but was able to talk with her sponsor. The patient reported she is seeing a new therapist and a new psychiatrist for her anxiety and racing thoughts. She also expressed concern that she is conflicted and why she is seeking treatment. She feels like she is in treatment for her parents and not herself. She received lots of feedback from other group members on her honest expressions. The patient also reported she stared taking Melatonin XL and she is finally sleeping! The patient gave good feedback to the family sculptures and was able to compare  her family to the families of other group members. Her sobriety date remains 2/7.     Family Program: Family present? No   Name of family member(s):   UDS collected: Yes Results: Pending  AA/NA attended?: YesFriday and Saturday  Sponsor?: Yes   MACKENZIE,LISBETH S, Licensed Cli

## 2015-02-05 ENCOUNTER — Other Ambulatory Visit (HOSPITAL_COMMUNITY): Payer: 59 | Admitting: Psychology

## 2015-02-05 ENCOUNTER — Encounter (HOSPITAL_COMMUNITY): Payer: Self-pay | Admitting: Psychology

## 2015-02-05 DIAGNOSIS — F10239 Alcohol dependence with withdrawal, unspecified: Secondary | ICD-10-CM | POA: Diagnosis not present

## 2015-02-06 ENCOUNTER — Encounter (HOSPITAL_COMMUNITY): Payer: Self-pay | Admitting: Psychology

## 2015-02-06 NOTE — Progress Notes (Signed)
    Daily Group Progress Note  Program: CD-IOP   Group Time: 1-2:30 pm  Participation Level: Active  Behavioral Response: Appropriate and Sharing  Type of Therapy: Process Group  Topic: :  After checking in, group members took turns sharing about what recovery related activities they engaged in since the last group meeting, as well as any challenges they faced.  Group members provided feedback, suggestions, and support for each other during the session.  There was one new group member and he took the time to introduce himself to the group and share about what has led him to treatment at this time.  Group Time: 2:45- 4pm  Participation Level: Active  Behavioral Response: Appropriate  Type of Therapy: Psycho-education Group  Topic: Sponsor & Sponsee: During the second part of group, a former group member and her sponsor visited the group to share their stories and discuss their relationship as sponsor and sponsee.  The group members commented and asked questions of the guests and were provided with information and feedback as needed.  Summary: The patient stated that she attended meetings on both Monday and Tuesday, and was able to treat herself to a manicure on Monday.  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  The patient reported that she was not feeling well this morning, but was able to come in for an individual session with her counseling.  She stated that her anxiety has decreased over the past few days and she has an appointment to see a new psychiatrist on Monday.  Other group members told the patient that they could see a remarkable difference in her presentation and that she seemed much less anxious and much more relaxed than in past sessions.  The patient is doing well, but reports that she still has frequent urges to drink.  Interventions are proving effective and her sobriety date remains 2/7.   Family  Program: Family present? No   Name of family member(s):   UDS collected: No Results:   AA/NA attended?: YesFriday and Saturday  Sponsor?: Yes   Delecia Vastine, LCAS

## 2015-02-06 NOTE — Progress Notes (Signed)
Jessica [name] is a 44 y.o. female patient. CD-IOP: Individual Session - Treatment Planning Session. I met with the patent as scheduled today. She reported a busy weekend, but she had attended 2 AA meetings. She had also met with her sponsor and the sponsor was with her for 1 of the meetings. The patient was agreeable to identifying treatment goals. She confirmed that her #1 goal of treatment is to stay sober. Her #2 goal is to build a support network. She admitted that she could not stop and stay stopped by herself. The patient reported she is anxious and although she is suffering much less anxiety now than in the past few weeks, she would like to learn how to reduce her anxiety or, as she put it, "manage it better". The patient reported she had met with a new counselor last week and is pleased with what they intend to work on, which includes techniques to address her anxiety. The patient reported she feels as if she needs to get some very challenging exercise and that this would help reduce her anxiety or stress. We googled a well-known gym and I found that there was a gym not far from her home and very close to many of the [place] meetings she attends. I explained that they only charge a monthly fee of $10. She agreed to speak with her father about this and would consider a way to join and work out at least 3-4 times per week. When asked about her sleep, the patient reported she is doing much better and slept well all weekend. She reminded me she is taking some melatonin capsules that are 'time-released' and they have proven extremely helpful. The session came to an end, but not before the treatment plan had been reviewed, signed and completed accordingly. The patient is feeling much better since she first entered the program and is a more active and engaged group member. She reported that she is also scheduled to meet with a new psychiatrist in [place] early next week. She is hoping this woman will help  identify what is really going on with her psychiatrically speaking and get her mediations adjusted to address her illness more effectively. We will continue to follow closely in the days ahead. The patient's sobriety date remains 2/7.       Shaquan Puerta, LCAS

## 2015-02-07 ENCOUNTER — Other Ambulatory Visit (HOSPITAL_COMMUNITY): Payer: 59 | Admitting: Psychology

## 2015-02-07 ENCOUNTER — Encounter (HOSPITAL_COMMUNITY): Payer: Self-pay | Admitting: Medical

## 2015-02-07 ENCOUNTER — Encounter: Payer: Self-pay | Admitting: Psychiatry

## 2015-02-07 DIAGNOSIS — F10239 Alcohol dependence with withdrawal, unspecified: Secondary | ICD-10-CM | POA: Diagnosis not present

## 2015-02-07 DIAGNOSIS — F418 Other specified anxiety disorders: Secondary | ICD-10-CM

## 2015-02-07 DIAGNOSIS — F319 Bipolar disorder, unspecified: Secondary | ICD-10-CM | POA: Insufficient documentation

## 2015-02-07 DIAGNOSIS — F1310 Sedative, hypnotic or anxiolytic abuse, uncomplicated: Secondary | ICD-10-CM

## 2015-02-07 DIAGNOSIS — J209 Acute bronchitis, unspecified: Secondary | ICD-10-CM | POA: Insufficient documentation

## 2015-02-07 DIAGNOSIS — Z811 Family history of alcohol abuse and dependence: Secondary | ICD-10-CM | POA: Insufficient documentation

## 2015-02-07 DIAGNOSIS — F4312 Post-traumatic stress disorder, chronic: Secondary | ICD-10-CM | POA: Insufficient documentation

## 2015-02-07 MED ORDER — PROPRANOLOL HCL 10 MG PO TABS: 10.0000 mg | ORAL_TABLET | Freq: Three times a day (TID) | ORAL | Status: DC
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
## 2015-02-10 ENCOUNTER — Other Ambulatory Visit (HOSPITAL_COMMUNITY): Payer: 59 | Admitting: Licensed Clinical Social Worker

## 2015-02-10 ENCOUNTER — Encounter (HOSPITAL_COMMUNITY): Payer: Self-pay | Admitting: Psychology

## 2015-02-10 DIAGNOSIS — F10239 Alcohol dependence with withdrawal, unspecified: Secondary | ICD-10-CM | POA: Diagnosis not present

## 2015-02-10 DIAGNOSIS — F1020 Alcohol dependence, uncomplicated: Secondary | ICD-10-CM

## 2015-02-10 DIAGNOSIS — F418 Other specified anxiety disorders: Secondary | ICD-10-CM
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
## 2015-02-11 ENCOUNTER — Encounter (HOSPITAL_COMMUNITY): Payer: Self-pay | Admitting: Licensed Clinical Social Worker

## 2015-02-11 NOTE — Progress Notes (Signed)
    Daily Group Progress Note  Program: CD-IOP   Group Time: 1-2:30  Participation Level: Active  Behavioral Response: Appropriate and Sharing  Type of Therapy: Process Group   Topic: After checking in, group members took turns sharing about recovery-related activities and challenges they faced since our last group meeting.   Group members were active during this discussion and many group members engaged in feedback to other group members. Drug tests were given out to all group members. One group member was absent because he was at the hospital welcoming his grandchild.    Group Time: 2:45-4  Participation Level: Active  Behavioral Response: Appropriate and Sharing  Type of Therapy: Psycho-education Group  Topic: The second portion of the group addressed codependency, part 1. Each group member was able to explain their own definition of codependency and identify codependency behaviors. Some group members self-identified as being codependent while others reported knowing people who are codependent. Group members discussed patterns in relationships which may exhibit codependent behaviors. The second part of codependency psychoeducation will be continued at the next group. One group member reported frustration that some group members, as well as some people in [place], have a lot of excuses for not going to meetings. Two drug test results were returned.  Summary: Patient reported she went to a meeting this weekend and asked for help staying sober. She felt shamed by a fellow AA member for asking for help. She reported that that incident now makes her not want to talk in [place] meetings any more. The patient reported she went to her new psychiatrist in [place] and the psychiatrist cannot work with her due to her co-occurring disorders and referred her to a psychiatrist in [place].  She cannot get an appointment with the new psychiatrist for a few months.  There was an appointment available her  with Dr. Adele Schilder at [facility] in 2 weeks. The patient reports she is really struggling with her anxiety and is unsure if she can keep her job due to her anxiety. She received positive feedback from several group members about how to deal with the shame she felt by being honest about her struggle of staying sober. The patient reported she also spoke with her sponsor by phone this weekend. The patient reported that she understands the definition of codependency and after much thought reported she has a codependent relationship with her father.  Her sobriety date remains 2/7.    Family Program: Family present? No   Name of family member(s):   UDS collected: Yes Results: Pending  AA/NA attended?: YesFriday  Sponsor?: Yes   Linsey Hirota S, Licensed Cli

## 2015-02-12 ENCOUNTER — Other Ambulatory Visit (HOSPITAL_COMMUNITY): Payer: 59 | Attending: Psychiatry | Admitting: Psychology

## 2015-02-12 DIAGNOSIS — F419 Anxiety disorder, unspecified: Secondary | ICD-10-CM | POA: Insufficient documentation

## 2015-02-12 DIAGNOSIS — F319 Bipolar disorder, unspecified: Secondary | ICD-10-CM | POA: Diagnosis not present

## 2015-02-12 DIAGNOSIS — F1020 Alcohol dependence, uncomplicated: Secondary | ICD-10-CM | POA: Insufficient documentation

## 2015-02-12 DIAGNOSIS — F418 Other specified anxiety disorders: Secondary | ICD-10-CM

## 2015-02-13 ENCOUNTER — Encounter (HOSPITAL_COMMUNITY): Payer: Self-pay | Admitting: Psychology

## 2015-02-13 NOTE — Progress Notes (Signed)
    Daily Group Progress Note  Program: CD-IOP   Group Time: 1-2:30 pm  Participation Level: Active  Behavioral Response: Appropriate  Type of Therapy: Process Group  Topic: After checking in, group members took turns sharing about any recovery related activities they engaged in since the last group meeting, as well as any challenges they may have faced.  The group then continued a discussion about "Codependency" and group members shared about their own relationships and the codependent behaviors they recognize in their lives. Drug test results collected on Monday were returned and all were negative.   Group Time: 2:45- 4pm  Participation Level: Active  Behavioral Response: Sharing  Type of Therapy: Psycho-education Group  Topic: Psychoeducation/Graduation:  During the second part of group, the discussion about "Codependency" continued.  Group members continued to share how the topic manifests itself in their lives and provided specific examples.  There was good feedback among members. As the session neared an end, a graduation ceremony was held for a group member who was successfully completing the program today. There were kind words of hope and validation provided to this man as he leaves this stage of recovery and moves onto the next phase of his new life.   Summary: The patient stated that she worked on Monday and Tuesday and was able to go to a meeting on Tuesday evening.  She told the group that she does not enjoy book study meetings, which is what that meeting was.  This morning she saw her psychiatrist, who does not think that she is really bipolar, but may just be on the wrong medications.  The thought of having to switch medications is very stressful for her, but the group was encouraging regarding the medication switch.  She also saw her therapist today and reported that she has feeling good.  The patient told the group that, to celebrate feeling good, she had an urge to go to  the liquor store to celebrate and could not figure out why that thought sounded appealing.  The patient is doing well and interventions are proving effective.  Her sobriety date remains 2/7.   Family Program: Family present? No   Name of family member(s):   UDS collected: No Results:   AA/NA attended?: YesTuesday  Sponsor?: Yes   Asra Gambrel, LCAS

## 2015-02-14 ENCOUNTER — Encounter: Payer: Self-pay | Admitting: Psychiatry

## 2015-02-14 ENCOUNTER — Other Ambulatory Visit (HOSPITAL_COMMUNITY): Payer: 59 | Admitting: Psychology

## 2015-02-14 DIAGNOSIS — F1020 Alcohol dependence, uncomplicated: Secondary | ICD-10-CM

## 2015-02-17 ENCOUNTER — Other Ambulatory Visit (HOSPITAL_COMMUNITY): Payer: 59 | Admitting: Licensed Clinical Social Worker

## 2015-02-17 ENCOUNTER — Encounter (HOSPITAL_COMMUNITY): Payer: Self-pay | Admitting: Psychology

## 2015-02-17 DIAGNOSIS — F1020 Alcohol dependence, uncomplicated: Secondary | ICD-10-CM

## 2015-02-17 NOTE — Progress Notes (Signed)
    Daily Group Progress Note  Program: CD-IOP   Group Time: 1-2:30 pm  Participation Level: Active  Behavioral Response: Appropriate and Sharing  Type of Therapy: Process Group  Topic: Process: the first part of group was spent in process. Members shared about obstacles, struggles and successes in early recovery. Two new group members were present and they were invited to share a little bit about themselves to the group and what they needed from the program. The program director met with a graduating member and the 2 new group members during this session. There was good feedback and disclosure among group members.  Group Time: 2:45- 4pm  Participation Level: Active  Behavioral Response: Sharing  Type of Therapy: Psycho-education Group/Graduation  Topic: Self-Esteem; Part 1/Graduation: the second part of group was spent in a psycho-ed on Self-Esteem and how Core Beliefs are developed. A slide show began the presentation and the group discussed the points provided on the slides. Members shared experiences or events that may have contributed to the forming of their self-esteem, especially memories that may have contributed to low self-esteem. Near the conclusion of the session, a graduation ceremony was held honoring a member who was successfully completing the program. There were kind words of hope and inspiration along with a few tears as this very popular group member said his "good byes". It was a very powerful and emotional ending to the week.   Summary: The patient reported she had attended 1 AA meeting since our last group session. She explained that she had worked a double yesterday and was scheduled to work 'doubles' both Saturday and Sunday. Another group member wondered why she is working so many hours? She pointed out that this can't be doing anything, but contributing to her increased stress level, which is already plenty high. I noted that she appeared to be putting the work  ahead of her recovery? The patient agreed that she would have to consider this because she agreed that it didn't leave her much time to do other things, like go to the gym. The patient also admitted, "I miss the taste of beer". This generated a lengthy discussion about romancing alcohol and drinking and there was good feedback. In the second half of group on Self-Esteem, the patient admitted that "criticism can be devastating". She acknowledged having a very "low-esteem" and recognized that she has a very 'imbalanced" view of herself. The patient reported that she feels as if her low sense of self began when her parents divorced when she was 44 yo. Desperate for continuity and consistency, there was none for a few years. During the graduation ceremony, the patient shared kind words to the graduating member and reported she had a lot of respect for him. The patient made some good comments and responded well to this intervention. 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    Family Program: Family present? No   Name of family member(s):   UDS collected: No Results:   AA/NA attended?: YesWednesday  Sponsor?: Yes   Khye Hochstetler, LCAS

## 2015-02-18 ENCOUNTER — Encounter (HOSPITAL_COMMUNITY): Payer: Self-pay | Admitting: Psychology

## 2015-02-18 NOTE — Progress Notes (Signed)
    Daily Group Progress Note  Program: CD-IOP   Group Time: 1-2:30 pm  Participation Level: Active  Behavioral Response: Appropriate and Sharing  Type of Therapy: Process Group  Topic: After checking in, group members took turns sharing about recovery-related activities and challenges they faced since our last group meeting.   Group members were active during this discussion and many group members engaged in feedback to other group members. Drug tests were given out to all group members.  Group Time: 2:45- 4PM  Participation Level: Active  Behavioral Response: Sharing and Resistant  Type of Therapy: Psycho-education Group  Topic: The second portion of group addressed core beliefs. A power point was shown on core beliefs and self-esteem. Many of the group members have developed negative core beliefs so there was much discussion on the topic. It was pointed out that different interpretations of life experiences can influence negative core beliefs. Each patient was able to identify their own personal core beliefs and challenge those beliefs in a safe environment.  Summary: The patient reported she went to meetings Friday and Sunday. She worked a double on Saturday but only worked one shift on Sunday. She shared with the group that her job is stressful and she is not sure how long she will keep the job.She noted that she might not be able to do the things she easily did earlier in her life now that she is in her 60's. The patient was able to report that she is experiencing less stress. She reports that in the past her stress comes and goes in waves. Other members were quick to point out how much she has improved and how much her anxiety has been reduced since she first entered the program. The patient was able to identify things she likes about herself, including having pretty eyes. She also identified positive attributes about herself that included being smart and loving. The patient was  somewhat hesitant about sharing in group today about her negative core beliefs and admitted she was embarrassed to share them with others. The patient received encouragement from the group and members were very helpful in assisting this woman in identifying her positive attributes. She admitted that she is her own worst critic and her self-talk is really negative. The patient shared and she responded well to this intervention. She continues to make progress in her recovery and her sobriety date remains 2/7.   Family Program: Family present? No   Name of family member(s):   UDS collected: Yes Results: PENDING  AA/NA attended?: YesFriday and Sunday  Sponsor?: Yes   Makiya Jeune, LCAS

## 2015-02-19 ENCOUNTER — Other Ambulatory Visit (HOSPITAL_COMMUNITY): Payer: 59 | Admitting: Psychology

## 2015-02-19 DIAGNOSIS — F1020 Alcohol dependence, uncomplicated: Secondary | ICD-10-CM | POA: Diagnosis not present

## 2015-02-19 NOTE — Progress Notes (Signed)
Jessica [name] is a 44 y.o. female patient. CD-IOP: Individual Counseling Session. I met with the patient at the conclusion of group today. When asked about the session on Core Beliefs, she admitted that she hadn't wanted to share about her core belief because it was embarrassing.  The patient identified efforts to "overcompensate" for her perceived unattractiveness by being overly friendly and doing everything over the top. Of course, her efforts were in vain because she was either misperceived or failed in those efforts. The patient also noted that she was very anxious when her parents divorced.  She lived with her mother for about 2 years and then back with her father for the remainder of her childhood and teen years. The patient was able to clearly see how those experiences contributed to her low self-esteem and fears of abandonment and even her ability to be truly loved. She admitted that, in looking back, she had sabotaged all of her relationships. 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 The patient reported she doesn't feel like she can do this as easily as she might have in her 67's and 30's. But her goal is to make as much money as possible over the next 6 months and then move back to [place]. "I really miss [place]", the patient explained. We agreed that this was a good plan, but I reminded her that nothing will come to fruition unless she stays sober and now is the time to really focus on her recovery. We discussed her medications. The patient reported she had stopped taking a number of them in accordance with her psychiatrist's orders. This would be the fellow in [facility] who she does not want to work with any longer. The patient reported she had eliminated: Vistaril, Trazadone, Gabapentin and Lamictal. She did resume taking the [place] and feels much less anxiety and presents much more relaxed. The patient is scheduled to meet with Dr. Adele Schilder next week. Regarding her treatment goals, the patient's  sobriety date is 2/7. She has a sponsor and is attending the minimum number of [place] meetings and I gave her the [facility] work sheets today. Her 3rd treatment goal was to address and reduce her anxiety. The patient is markedly less anxious, but it would appear that this reduction is due to more consistency in her daily life and medications. She is resistance to behavioral intervention such as prayer, meditation or breathing and expressed frustration because they are difficult. We will continue to work closely with this patient. Her sobriety date remains 2/7.         Efrem Pitstick, LCAS

## 2015-02-20 ENCOUNTER — Encounter (HOSPITAL_COMMUNITY): Payer: Self-pay | Admitting: Psychology

## 2015-02-20 NOTE — Progress Notes (Signed)
    Daily Group Progress Note  Program: CD-IOP   Group Time: 1-2:30 pm  Participation Level: Active  Behavioral Response: Sharing  Type of Therapy: Process Group  Topic: After checking in, the group went outside to complete a walking meditation.  After returning inside, group members shared and how the experience was for them.  Group members then took turns sharing about any recovery-related activities they had engaged in since the last group meeting, as well as any challenges they faced.  One group member reported that he had relapsed.  Drug test results were returned to some group members, and drug tests were collected from others.  Group Time: 2:45- 4pm  Participation Level: Active  Behavioral Response: Appropriate  Type of Therapy: Psycho-education Group  Topic: During the second portion of the group, counselors checked in with group members about the topic of core beliefs, and gave them some time to share what they had been thinking about and reflecting on over the past couple of days.  Group members then completed a mask activity.  On the front of the mask, group members were prompted to write words that express how they present themselves to the world and what others may see when looking at them.  On the inside of the mask, group members wrote words to show how they truly feel about themselves.  After the activity, they took turns sharing about their masks and discussed the differences between the inside and outside of their mask.  Summary: The patient stated that she worked on Monday and Tuesday, and attended a meeting yesterday, where she received her 30-day chip.  She told the group that her parents took her out to a celebratory dinner, but that she felt very anxious at the restaurant and does not know why.  She also stated that she is not giving herself enough credit for her achievement, because she has had longer periods of sobriety in the past.  Today, the patient saw her  therapist and stated that her anxiety is low.  During the mask activity, she wrote words such as "confident", "happy", and "in control" on the front of her mask, while on the inside she wrote "sad", "miserable", and "anxious".  The patient is doing well and interventions are proving effective.  Her sobriety date remains 2/7.   Family Program: Family present? No   Name of family member(s):   UDS collected: No Results:   AA/NA attended?: YesMonday and Tuesday  Sponsor?: Yes   Jessica [name], LCAS

## 2015-02-21 ENCOUNTER — Encounter: Payer: Self-pay | Admitting: Psychiatry

## 2015-02-21 ENCOUNTER — Other Ambulatory Visit (HOSPITAL_COMMUNITY): Payer: 59 | Admitting: Psychology

## 2015-02-21 DIAGNOSIS — F1020 Alcohol dependence, uncomplicated: Secondary | ICD-10-CM | POA: Diagnosis not present

## 2015-02-21 DIAGNOSIS — F418 Other specified anxiety disorders: Secondary | ICD-10-CM

## 2015-02-24 ENCOUNTER — Encounter (HOSPITAL_COMMUNITY): Payer: Self-pay | Admitting: Psychology

## 2015-02-24 ENCOUNTER — Other Ambulatory Visit (HOSPITAL_COMMUNITY): Payer: 59 | Admitting: Licensed Clinical Social Worker

## 2015-02-24 DIAGNOSIS — F1020 Alcohol dependence, uncomplicated: Secondary | ICD-10-CM | POA: Diagnosis not present

## 2015-02-24 NOTE — Progress Notes (Signed)
    Daily Group Progress Note  Program: CD-IOP   Group Time: 1-2:30 pm  Participation Level: Active  Behavioral Response: Sharing and Rationalizing  Type of Therapy: Process Group  Topic: Process: the first part of group was spent in process. Members shared about current issues and concerns in early recovery. During this session, the medical director met with 2 new patients and another who will be leaving soon. There was good feedback and discussion among group members.  Group Time: 2:45- 4pm  Participation Level: Active  Behavioral Response: Sharing and Resistant  Type of Therapy: Psycho-education Group  Topic: Masks, Part 2: the second half of group was spent in a psycho-ed. It was a continuation of the previous group session and included the Masks members had made on Wednesday. Today, members were asked to write on their fellow group members' mask. They were asked to write comments or words to describe how they perceived the member. Upon completing this exercise, members read the comments out loud. Most were touched by the kind words, but one member became frustrated because she didn't agree with the words describing her. Members were encouraged to attend meetings and reach out to others in recovery over the weekend.   Summary: The patient reported she had attended an [place] meeting on Wednesday evening. She hadn't done much yesterday. When asked about speaking with her sponsor, the patient reported she had not spoken to her since Tuesday. I pointed out that without any sort of routine or consistent schedule, she loses the checkpoints that demonstrate she is progressing in her recovery. Instead, she has no routine. The patient pointed out that her job hours change constantly. I noted that she could build a weekly schedule around her work schedule. She had not done anything outdoors during these past few incredibly beautiful days, including no walking, which we have discussed in the past. I  noted that her recovery doesn't appear to have much importance to her and she admitted that she isn't feeling much intention. She did report that she had remembered to say a prayer in the mornings. In the second half of group, the patient seemed puzzled and read the words written on her mask by other group members. They included: anxious, not willing to accept compliments, confused and lost. The patient was confused because she does not feel she is lost or confused, but rather pretty clear about things. It began to appear as if these observations had hurt her feelings. When I asked her if she was okay, she insisted she was just fine. This woman is not working very hard on her recovery and has grown more negative and less hopeful over the past week or so. She doesn't seem to see it herself, but other group members expressed concerns about her. The patient's sobriety date remains 2/7.    Family Program: Family present? No   Name of family member(s):   UDS collected: No Results:   AA/NA attended?: YesWednesday  Sponsor?: Yes   Jeannifer Drakeford, LCAS

## 2015-02-25 ENCOUNTER — Encounter (HOSPITAL_COMMUNITY): Payer: Self-pay | Admitting: Psychiatry

## 2015-02-25 ENCOUNTER — Encounter (INDEPENDENT_AMBULATORY_CARE_PROVIDER_SITE_OTHER): Payer: Self-pay

## 2015-02-25 ENCOUNTER — Ambulatory Visit (INDEPENDENT_AMBULATORY_CARE_PROVIDER_SITE_OTHER): Payer: 59 | Admitting: Psychiatry

## 2015-02-25 VITALS — BP 96/60 | HR 76 | Ht 68.0 in | Wt 143.6 lb

## 2015-02-25 DIAGNOSIS — F1020 Alcohol dependence, uncomplicated: Secondary | ICD-10-CM

## 2015-02-25 DIAGNOSIS — F3130 Bipolar disorder, current episode depressed, mild or moderate severity, unspecified: Secondary | ICD-10-CM

## 2015-02-25 MED ORDER — MIRTAZAPINE 15 MG PO TABS: 15.0000 mg | ORAL_TABLET | Freq: Every day | ORAL | Status: DC

## 2015-02-25 MED ORDER — ASENAPINE MALEATE 5 MG SL SUBL: 5.0000 mg | SUBLINGUAL_TABLET | Freq: Every day | SUBLINGUAL | Status: DC

## 2015-02-25 MED ORDER — DIVALPROEX SODIUM 500 MG PO DR TAB: DELAYED_RELEASE_TABLET | ORAL | Status: DC

## 2015-02-25 NOTE — Progress Notes (Signed)
Jessica [name] is a 44 y.o. female patient. CD-IOP: Treatment Plan Update. I met with the patient at the conclusion of her group session today. We discussed her progress in treatment. I pointed out that she seems very negative and not really hearing the possibilities that are available to her as someone in recovery. I noted that she doesn't seem to be having any fun. The patient agreed that she isn't having any fun and complained about living in [place]. I encouraged her to consider reaching out to the women she has met in our program. Perhaps she could meet them for AA meetings and lunch? She complained that there is nothing to do where she lives, but she admitted that in the past few years, while living in [place], [place], all she did was work and drink. She didn't enjoy any outdoor sports or hiking [place]. The patient agreed she would begin to reach out a little more. We discussed her treatment goals and I applauded her picking up her 30 day chip last week. Her sobriety date remains 2/7. She attended 4 AA meetings this weekend and this was good. The patient admitted she probably needs a new sponsor because they rarely see each other and she doesn't call her with any consistency. Regarding her 3rd goal of treatment, the patient is less anxious than when she first entered the program, but she has an appointment with Dr. Adele Schilder here in the outpatient department tomorrow and they will discuss her mental state at length during that session. She would like to experience less anxiety, but I reminded her that she would not be able to use klonipin going forward, but learn behavioral strategies and interventions that are healthier and more effective. The patient was pleasant and agreed that she would do better. The documentation was reviewed, signed and the treatment plan updated accordingly. We will continue to follow closely in the days ahead. The patient  is making progress in her recovery and her sobriety date remains 2/7.         Caedan Sumler, LCAS

## 2015-02-25 NOTE — Progress Notes (Signed)
    Daily Group Progress Note  Program: CD-IOP   Group Time: 1-2:30  Participation Level: Active  Behavioral Response: Appropriate and Sharing  Type of Therapy: Process Group  Topic: After checking in, group members took turns sharing about recovery-related activities and challenges they faced since our last group meeting.   Group members were active during this discussion and many group members engaged in feedback to other group members. Drug tests were given out to all group members. One patient shared with the group that he relapsed last night.  Patients in the group gave him feedback on the importance of calling other group members when there is an urge to use. There was a new member that began the group today who introduced himself and was welcomed by the group.     Group Time: 2:45-4  Participation Level: Active  Behavioral Response: Appropriate and Sharing  Type of Therapy: Psycho-education Group  Topic: The second half of the group was psychoeducation on HIV/STD's presented by a health educator from [facility]. She also gave out condoms, and answered questions from the group members. All of the group members were active participants during the presentation. After the group presentation, the rest of the group finished check in with recovery-related activities and challenges.   Summary: The patient reported she is sleeping a lot and didn't want to previously share it with the group. She was encouraged by another group member to be honest in sharing. The patient is confused about why she is sleeping so much. She reports she doesn't believe she is depressed but usually has more anxiety than depression. The patient reports she went to 4 meetings over the weekend. She went to a meeting with 2 other group members this morning and then they went out to lunch. The patient worked Sunday and read the "[place]." The patient was congratulated from other group members on  going and sharing in meetings over the weekend. The patient reported she was interested in the presentation and gained some good information. Her sobriety date remains 2/7.   Family Program: Family present? No   Name of family member(s):   UDS collected: Yes Results: Pending  AA/NA attended?: YesFriday, Saturday, Sunday and Monday  Sponsor?: Yes   Elara Cocke S, Licensed Cli

## 2015-02-25 NOTE — Progress Notes (Addendum)
[facility] Behavioral Health Initial Assessment Note  Jessica [name] [id number] 44 y.o.  02/25/2015 9:55 AM  Chief Complaint:  I have a lot of anxiety and depression.  My medicine is not working.  History of Present Illness:  Patient is 44 year old Caucasian, single, employed female who is referred from [facility] for the management of her psychiatric illness.  Patient is diagnosed with bipolar disorder and alcohol dependence.  She was seeing psychiatrist Dr. Charlesetta Garibaldi in [facility] however she liked to get psychiatrist in this area which is close to her residence.  She is living in [place] with her father and stepmother.  Currently she is taking multiple psychotropic medication however she does not believe it is working.  She was admitted to behavioral [facility] in January 2016 because of alcohol dependency and worsening of her bipolar disorder.  She relapsed into alcohol after 3 weeks of sobriety.  In December she was admitted and completed [facility] treatment center but started to decompensate very soon.  Upon admission at behavioral [facility] her blood alcohol level was 363.  She was positive for barbiturates and benzodiazepine which she believed given when she was in the emergency room and at [facility] treatment center.  She told she was getting phenobarbital at treatment Center.  Patient told upon discharge her Saphris was discontinued because she felt it was gives muscle twitching and she was very scared for tardive dyskinesia.  She was discharged on BuSpar, Depakote, gabapentin, Vistaril, Lamictal, Remeron and Cogentin.  However her psychiatrist resume saphris because she felt very depressed anxious .  She is no longer taking Lamictal, Vistaril and gabapentin.  Her current medicines are BuSpar, Remeron, Saphris, Inderal and Cogentin.  However she is taking lower doses of Remeron and Remeron.  Her tremors or shakes are resolved.  She still have a lot of symptoms of anxiety and  depression and she does not believe she has bipolar disorder.  She admitted poor sleep, irritability, frustration, anger and paranoia.  However she denies any hallucination or any active or passive suicidal thoughts.  She reported her energy level is low and she still feels very anxious and nervous around people.  She admitted some time hopeless and worthless with isolation and fatigue.  She also endorse irritable mood and getting easily distracted with impulsive behavior.  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  Patient is from [place] and liked to go back because she missed living there and her family friends.  Patient is going to [place] meeting and claims to be sober since she left the hospital.  She denies any nightmares, flashback, panic attacks, OCD symptoms at this time.  She does not feel her current medicine is working.  She is still in the program and hoping to graduate next month.   Suicidal Ideation: No Plan Formed: No Patient has means to carry out plan: No  Homicidal Ideation: No Plan Formed: No Patient has means to carry out plan: No  Past Psychiatric History/Hospitalization(s) Patient is started seeing psychiatrist at age 44.  She has at least 6-7 hospitalization mostly triggered by alcohol and drug use.  She has diagnosed bipolar disorder when she was in [place].  She denies any suicidal attempt but admitted suicidal thoughts.  She endorse history of mood swing irritability and impulsive behavior.  In the past she had tried Risperdal, Zoloft, Lamictal, Vistaril, gabapentin, trazodone.  Her last psychiatric admission was at [facility] in January 2016.  Currently she is in [facility] .  She has been in various treatment Center for alcohol problem.   Anxiety: Yes Bipolar Disorder: Yes Depression: Yes Mania: No Psychosis: No Schizophrenia: No Personality Disorder: No Hospitalization for psychiatric illness: Yes History of  Electroconvulsive Shock Therapy: No Prior Suicide Attempts: No  Medical History; Patient has no active medical problem.  She has no primary care physician.  She denies any history of seizures.  Traumatic brain injury: Patient denies any history of traumatic brain injury.  Family History; Unknown  Education and Work History; Patient has bachelor degree.  Currently she is working as a Educational psychologist.  Psychosocial History; Patient born in [place] and grew up in [place].  Her parents divorced in 79.  She had lived in [place] most of her life.  She had lived with her mother who is in [place] and now she moved to live with her father and her stepmother.  Patient never married however she has multiple relationship in the past which ended.  She has no children.  Legal History; Patient denies any legal issues.  History Of Abuse; Patient denies any history of abuse.  Substance Abuse History; Patient admitted history of drinking heavily in the past.  She has been admitted to multiple places for rehabilitation.  She has history of tremors, shakes and withdrawal symptoms.  She had used cannabis and cocaine in the past.  Her drug of choice is alcohol.  Currently she is in [facility] IOP and attending AA meetings  Review of Systems: Psychiatric: Agitation: No Hallucination: No Depressed Mood: Yes Insomnia: Yes Hypersomnia: No Altered Concentration: No Feels Worthless: No Grandiose Ideas: No Belief In Special Powers: No New/Increased Substance Abuse: No Compulsions: No  Neurologic: Headache: No Seizure: No Paresthesias: No   Musculoskeletal: Strength & Muscle Tone: within normal limits Gait & Station: normal Patient leans: N/A   Outpatient Encounter Prescriptions as of 02/25/2015  Medication Sig  . asenapine (SAPHRIS) 5 MG SUBL 24 hr tablet Place 1 tablet (5 mg total) under the tongue at bedtime.  . divalproex (DEPAKOTE) 500 MG DR tablet Take 1 tab in am and 2 at bed  time  . mirtazapine (REMERON) 15 MG tablet Take 1 tablet (15 mg total) by mouth at bedtime. For depression/sleep  . [DISCONTINUED] asenapine (SAPHRIS) 5 MG SUBL 24 hr tablet Place 5 mg under the tongue 1 day or 1 dose.  . [DISCONTINUED] benztropine (COGENTIN) 1 MG tablet Take 1 tablet (1 mg total) by mouth 2 (two) times daily as needed for tremors. (Patient not taking: Reported on 02/18/2015)  . [DISCONTINUED] busPIRone (BUSPAR) 5 MG tablet Take 1 tablet (5 mg total) by mouth 3 (three) times daily. For anxiety  . [DISCONTINUED] chlordiazePOXIDE (LIBRIUM) 25 MG capsule Take 25 mg by mouth.  . [DISCONTINUED] divalproex (DEPAKOTE) 500 MG DR tablet Take 1 tablet (500 mg total) by mouth 2 (two) times daily. For mood stabilization  . [DISCONTINUED] gabapentin (NEURONTIN) 400 MG capsule Take 1 capsule (400 mg) four times daily: For agitation (Patient not taking: Reported on 02/24/2015)  . [DISCONTINUED] hydrOXYzine (ATARAX/VISTARIL) 25 MG tablet Take 1 tablet (25 mg total) by mouth 3 (three) times daily as needed for anxiety (anxiety). (Patient not taking: Reported on 02/18/2015)  . [DISCONTINUED] lamoTRIgine (LAMICTAL) 25 MG tablet Take 1 tablet (25 mg total) by mouth every other day. For mood stabilization (Patient not taking: Reported on 02/18/2015)  . [DISCONTINUED] mirtazapine (REMERON) 30 MG tablet Take 1 tablet (30 mg total) by  mouth at bedtime. For depression/sleep (Patient taking differently: Take 15 mg by mouth at bedtime. For depression/sleep)  . [DISCONTINUED] propranolol (INDERAL) 10 MG tablet Take 1 tablet (10 mg total) by mouth 3 (three) times daily.    Recent Results (from the past 2160 hour(s))  Valproic acid level     Status: Abnormal   Collection Time: 01/07/15  5:11 PM  Result Value Ref Range   Valproic Acid Lvl 35.2 (L) 50.0 - 100.0 ug/mL    Comment: Performed at [facility]  CBC WITH DIFFERENTIAL     Status: Abnormal   Collection Time: 01/07/15  5:13 PM  Result Value Ref Range    WBC 8.8 4.0 - 10.5 K/uL   RBC 4.24 3.87 - 5.11 MIL/uL   Hemoglobin 13.2 12.0 - 15.0 g/dL   HCT 40.0 36.0 - 46.0 %   MCV 94.3 78.0 - 100.0 fL   MCH 31.1 26.0 - 34.0 pg   MCHC 33.0 30.0 - 36.0 g/dL   RDW 15.7 (H) 11.5 - 15.5 %   Platelets 338 150 - 400 K/uL   Neutrophils Relative % 72 43 - 77 %   Neutro Abs 6.3 1.7 - 7.7 K/uL   Lymphocytes Relative 18 12 - 46 %   Lymphs Abs 1.5 0.7 - 4.0 K/uL   Monocytes Relative 9 3 - 12 %   Monocytes Absolute 0.8 0.1 - 1.0 K/uL   Eosinophils Relative 0 0 - 5 %   Eosinophils Absolute 0.0 0.0 - 0.7 K/uL   Basophils Relative 1 0 - 1 %   Basophils Absolute 0.1 0.0 - 0.1 K/uL  Comprehensive metabolic panel     Status: Abnormal   Collection Time: 01/07/15  5:13 PM  Result Value Ref Range   Sodium 135 135 - 145 mmol/L   Potassium 4.1 3.5 - 5.1 mmol/L   Chloride 98 96 - 112 mmol/L   CO2 24 19 - 32 mmol/L   Glucose, Bld 154 (H) 70 - 99 mg/dL   BUN 6 6 - 23 mg/dL   Creatinine, Ser 0.50 0.50 - 1.10 mg/dL   Calcium 8.5 8.4 - 10.5 mg/dL   Total Protein 6.8 6.0 - 8.3 g/dL   Albumin 4.1 3.5 - 5.2 g/dL   AST 55 (H) 0 - 37 U/L   ALT 64 (H) 0 - 35 U/L   Alkaline Phosphatase 61 39 - 117 U/L   Total Bilirubin 0.3 0.3 - 1.2 mg/dL   GFR calc non Af Amer >90 >90 mL/min   GFR calc Af Amer >90 >90 mL/min    Comment: (NOTE) The eGFR has been calculated using the CKD EPI equation. This calculation has not been validated in all clinical situations. eGFR's persistently <90 mL/min signify possible Chronic Kidney Disease.    Anion gap 13 5 - 15  Ethanol     Status: Abnormal   Collection Time: 01/07/15  5:13 PM  Result Value Ref Range   Alcohol, Ethyl (B) 278 (H) 0 - 9 mg/dL    Comment:        LOWEST DETECTABLE LIMIT FOR SERUM ALCOHOL IS 11 mg/dL FOR MEDICAL PURPOSES ONLY   Drug screen panel, emergency     Status: Abnormal   Collection Time: 01/07/15  5:29 PM  Result Value Ref Range   Opiates NONE DETECTED NONE DETECTED   Cocaine NONE DETECTED NONE  DETECTED   Benzodiazepines POSITIVE (A) NONE DETECTED   Amphetamines NONE DETECTED NONE DETECTED   Tetrahydrocannabinol NONE DETECTED NONE DETECTED  Barbiturates POSITIVE (A) NONE DETECTED    Comment:        DRUG SCREEN FOR MEDICAL PURPOSES ONLY.  IF CONFIRMATION IS NEEDED FOR ANY PURPOSE, NOTIFY LAB WITHIN 5 DAYS.        LOWEST DETECTABLE LIMITS FOR URINE DRUG SCREEN Drug Class       Cutoff (ng/mL) Amphetamine      1000 Barbiturate      200 Benzodiazepine   354 Tricyclics       656 Opiates          300 Cocaine          300 THC              50   TSH     Status: None   Collection Time: 01/08/15  7:36 PM  Result Value Ref Range   TSH 2.858 0.350 - 4.500 uIU/mL    Comment: Performed at [facility]  Lipid panel     Status: None   Collection Time: 01/08/15  7:36 PM  Result Value Ref Range   Cholesterol 158 0 - 200 mg/dL   Triglycerides 83 <150 mg/dL   HDL 85 >39 mg/dL   Total CHOL/HDL Ratio 1.9 RATIO   VLDL 17 0 - 40 mg/dL   LDL Cholesterol 56 0 - 99 mg/dL    Comment:        Total Cholesterol/HDL:CHD Risk Coronary Heart Disease Risk Table                     Men   Women  1/2 Average Risk   3.4   3.3  Average Risk       5.0   4.4  2 X Average Risk   9.6   7.1  3 X Average Risk  23.4   11.0        Use the calculated Patient Ratio above and the CHD Risk Table to determine the patient's CHD Risk.        ATP III CLASSIFICATION (LDL):  <100     mg/dL   Optimal  100-129  mg/dL   Near or Above                    Optimal  130-159  mg/dL   Borderline  160-189  mg/dL   High  >190     mg/dL   Very High Performed at [facility]   CBC WITH DIFFERENTIAL     Status: Abnormal   Collection Time: 01/12/15 11:45 PM  Result Value Ref Range   WBC 8.1 4.0 - 10.5 K/uL   RBC 3.48 (L) 3.87 - 5.11 MIL/uL   Hemoglobin 11.1 (L) 12.0 - 15.0 g/dL   HCT 33.6 (L) 36.0 - 46.0 %   MCV 96.6 78.0 - 100.0 fL   MCH 31.9 26.0 - 34.0 pg   MCHC 33.0 30.0 - 36.0 g/dL   RDW 15.6  (H) 11.5 - 15.5 %   Platelets 215 150 - 400 K/uL   Neutrophils Relative % 55 43 - 77 %   Neutro Abs 4.5 1.7 - 7.7 K/uL   Lymphocytes Relative 38 12 - 46 %   Lymphs Abs 3.1 0.7 - 4.0 K/uL   Monocytes Relative 5 3 - 12 %   Monocytes Absolute 0.4 0.1 - 1.0 K/uL   Eosinophils Relative 1 0 - 5 %   Eosinophils Absolute 0.1 0.0 - 0.7 K/uL   Basophils Relative 1 0 - 1 %  Basophils Absolute 0.1 0.0 - 0.1 K/uL  Comprehensive metabolic panel     Status: Abnormal   Collection Time: 01/12/15 11:45 PM  Result Value Ref Range   Sodium 141 135 - 145 mmol/L   Potassium 3.8 3.5 - 5.1 mmol/L   Chloride 108 96 - 112 mmol/L   CO2 26 19 - 32 mmol/L   Glucose, Bld 84 70 - 99 mg/dL   BUN 7 6 - 23 mg/dL   Creatinine, Ser 0.48 (L) 0.50 - 1.10 mg/dL   Calcium 8.1 (L) 8.4 - 10.5 mg/dL   Total Protein 5.9 (L) 6.0 - 8.3 g/dL   Albumin 3.5 3.5 - 5.2 g/dL   AST 12 0 - 37 U/L   ALT 20 0 - 35 U/L   Alkaline Phosphatase 43 39 - 117 U/L   Total Bilirubin 0.3 0.3 - 1.2 mg/dL   GFR calc non Af Amer >90 >90 mL/min   GFR calc Af Amer >90 >90 mL/min    Comment: (NOTE) The eGFR has been calculated using the CKD EPI equation. This calculation has not been validated in all clinical situations. eGFR's persistently <90 mL/min signify possible Chronic Kidney Disease.    Anion gap 7 5 - 15  Ethanol     Status: Abnormal   Collection Time: 01/12/15 11:45 PM  Result Value Ref Range   Alcohol, Ethyl (B) 363 (H) 0 - 9 mg/dL    Comment:        LOWEST DETECTABLE LIMIT FOR SERUM ALCOHOL IS 11 mg/dL FOR MEDICAL PURPOSES ONLY   Salicylate level     Status: None   Collection Time: 01/12/15 11:45 PM  Result Value Ref Range   Salicylate Lvl <4.8 2.8 - 20.0 mg/dL  Acetaminophen level     Status: Abnormal   Collection Time: 01/12/15 11:45 PM  Result Value Ref Range   Acetaminophen (Tylenol), Serum <10.0 (L) 10 - 30 ug/mL    Comment:        THERAPEUTIC CONCENTRATIONS VARY SIGNIFICANTLY. A RANGE OF 10-30 ug/mL MAY BE AN  EFFECTIVE CONCENTRATION FOR MANY PATIENTS. HOWEVER, SOME ARE BEST TREATED AT CONCENTRATIONS OUTSIDE THIS RANGE. ACETAMINOPHEN CONCENTRATIONS >150 ug/mL AT 4 HOURS AFTER INGESTION AND >50 ug/mL AT 12 HOURS AFTER INGESTION ARE OFTEN ASSOCIATED WITH TOXIC REACTIONS.   Drug screen panel, emergency     Status: Abnormal   Collection Time: 01/13/15  3:22 AM  Result Value Ref Range   Opiates NONE DETECTED NONE DETECTED   Cocaine NONE DETECTED NONE DETECTED   Benzodiazepines POSITIVE (A) NONE DETECTED   Amphetamines NONE DETECTED NONE DETECTED   Tetrahydrocannabinol NONE DETECTED NONE DETECTED   Barbiturates POSITIVE (A) NONE DETECTED    Comment:        DRUG SCREEN FOR MEDICAL PURPOSES ONLY.  IF CONFIRMATION IS NEEDED FOR ANY PURPOSE, NOTIFY LAB WITHIN 5 DAYS.        LOWEST DETECTABLE LIMITS FOR URINE DRUG SCREEN Drug Class       Cutoff (ng/mL) Amphetamine      1000 Barbiturate      200 Benzodiazepine   185 Tricyclics       631 Opiates          300 Cocaine          300 THC              50   I-Stat CG4 Lactic Acid, ED     Status: None   Collection Time: 01/13/15  5:17 AM  Result Value Ref  Range   Lactic Acid, Venous 1.11 0.5 - 2.0 mmol/L  I-stat chem 8, ed     Status: Abnormal   Collection Time: 01/13/15  8:11 AM  Result Value Ref Range   Sodium 148 (H) 135 - 145 mmol/L   Potassium 4.3 3.5 - 5.1 mmol/L   Chloride 113 (H) 96 - 112 mmol/L   BUN <3 (L) 6 - 23 mg/dL   Creatinine, Ser 0.80 0.50 - 1.10 mg/dL   Glucose, Bld 75 70 - 99 mg/dL   Calcium, Ion 1.00 (L) 1.12 - 1.23 mmol/L   TCO2 21 0 - 100 mmol/L   Hemoglobin 12.2 12.0 - 15.0 g/dL   HCT 36.0 36.0 - 46.0 %      Constitutional:  BP 96/60 mmHg  Pulse 76  Ht '5\' 8"'  (1.727 m)  Wt 143 lb 9.6 oz (65.137 kg)  BMI 21.84 kg/m2   Mental Status Examination;  Patient is well groomed well dressed female who appears anxious but cooperative.  Her speech is fast but clear and coherent.  She described her mood sad and  depressed.  Her affect is constricted.  She denies any auditory or visual hallucination.  Her attention concentration is fair.  There were no delusions or any obsessive thoughts.  She admitted paranoia.  Her psychomotor activity is slightly increased.  She has no tremors or shakes.  Her fund of knowledge is good.  Her cognition is good.  She is alert and oriented 3.  Her insight judgment and impulse control is okay.   New problem, with additional work up planned, Review of Psycho-Social Stressors (1), Review or order clinical lab tests (1), Decision to obtain old records (1), Review and summation of old records (2), Established Problem, Worsening (2), New Problem, with no additional work-up planned (3), Independent Review of image, tracing or specimen (2) and Review of New Medication or Change in Dosage (2)  Assessment: Axis I: Bipolar disorder depressed type, alcohol dependence  Axis II: Deferred  Axis III:  Past Medical History  Diagnosis Date  . Anxiety   . Bipolar 1 disorder      Plan:  I review her symptoms, history, current medication, blood work results and her current medication.  I discussed in length about her medication.  She is taking multiple psychotropic medication but in low doses.  I recommended to discontinue propranolol, Vistaril, gabapentin, Cogentin since she is taking these medication in low doses.  I recommended to increase Depakote to take 500 mg in the morning and thousand milligram at bedtime.  Her last Depakote level was 37 when she was admitted to the hospital.  Continue Saphris 5 mg at bedtime.  She is taking Remeron prescribed 30 mg she is only taking 15 mg to help sleep.  I will continue for now 15 mg but consider increasing the dose if her anxiety and depression does not get better.  Discuss in detail the medication side effects and benefits.  Encouraged to attend and keep appointment in [facility].  Patient will require Counselor once she finished the program.   Recommended to call us back if she has any question, concern or if she feels worsening of the symptom.  We will get Depakote level.  Follow-up in 3 weeks.Time spent 55 minutes.  More than 50% of the time spent in psychoeducation, counseling and coordination of care.  Discuss safety plan that anytime having active suicidal thoughts or homicidal thoughts then patient need to call 911 or go to the local  emergency room.    Yunus Stoklosa T., MD 02/25/2015

## 2015-02-26 ENCOUNTER — Other Ambulatory Visit (HOSPITAL_COMMUNITY): Payer: 59 | Admitting: Psychology

## 2015-02-26 DIAGNOSIS — F1020 Alcohol dependence, uncomplicated: Secondary | ICD-10-CM

## 2015-02-26 DIAGNOSIS — F3130 Bipolar disorder, current episode depressed, mild or moderate severity, unspecified: Secondary | ICD-10-CM

## 2015-02-27 ENCOUNTER — Encounter (HOSPITAL_COMMUNITY): Payer: Self-pay | Admitting: Psychology

## 2015-02-27 NOTE — Progress Notes (Signed)
    Daily Group Progress Note  Program: [facility]-IOP   Group Time: 1-2:30 pm  Participation Level: Active  Behavioral Response: Appropriate and Sharing  Type of Therapy: Psycho-education Group  Topic: Mindfulness Meditation.  The first part of group was spent in a presentation with a visiting Chaplain. BH introduced the practice of mindfulness meditation and described some of the essential attitudes of meditation as well as some of the hindrances. After questions and observations, the chaplain led a 5 minute body-scan meditation. Almost every group member found the meditation extremely relaxing. The session proved very educational and enjoyable.   Group Time: 2:45- 4pm  Participation Level: Active  Behavioral Response: Sharing  Type of Therapy: Process Group  Topic: Process/Graduation:  Following the break, group members engaged in a process session. Members shared about current issues and concerns in early recovery. One member shared that he had attended a meeting and picked up a starter chip. Another member shared about her challenges with her family. Near the end of the session, a graduation ceremony was held honoring a successfully graduating member. Kind words were shared as members passed around the medallion and the ceremony proved very emotional. The graduating member's 2 daughters arrived for the ceremony and their presence made her completion even more special.   Summary: The patient stated that she worked on Monday evening, and attended a meeting on Tuesday where she saw 2 other group members.  She saw a new psychiatrist as well who made some changes to her medications. She admitted she was pleased that he had reduced the number of meds she is taking.  The patient told the group that she served a lot of liquor at work on Tuesday night, and there was a man at the bar drinking a shot and a beer, which is what she used to drink.  The patient stated that she was very triggered and had  strong cravings all night. However, once she got home and watched TV, the cravings had passed.  She met with her therapist this morning and stated that she is hesitant to talk with her sponsor about possibly changing to a new sponsor who is more available.  The patient had trouble with the guided meditation during the psychoeducational portion of group, but seemed calm.  She reported that she doesn't like to close her eyes unless she is sleeping. The patient appears stable and is doing well.  Her sobriety date remains 2/7.   Family Program: Family present? No   Name of family member(s):   UDS collected: No Results:   AA/NA attended?: [place]  Sponsor?: Yes, but she has minimal contact with her and the group has encouraged her to secure a new sponsor   Jessica [name], LCAS

## 2015-02-28 ENCOUNTER — Other Ambulatory Visit (HOSPITAL_COMMUNITY): Payer: 59 | Admitting: Psychology

## 2015-02-28 DIAGNOSIS — F1020 Alcohol dependence, uncomplicated: Secondary | ICD-10-CM | POA: Diagnosis not present

## 2015-02-28 DIAGNOSIS — F3130 Bipolar disorder, current episode depressed, mild or moderate severity, unspecified: Secondary | ICD-10-CM

## 2015-03-03 ENCOUNTER — Other Ambulatory Visit (HOSPITAL_COMMUNITY): Payer: 59 | Admitting: Psychology

## 2015-03-03 ENCOUNTER — Encounter (HOSPITAL_COMMUNITY): Payer: Self-pay | Admitting: Psychology

## 2015-03-03 DIAGNOSIS — F1020 Alcohol dependence, uncomplicated: Secondary | ICD-10-CM | POA: Diagnosis not present

## 2015-03-03 DIAGNOSIS — F3130 Bipolar disorder, current episode depressed, mild or moderate severity, unspecified: Secondary | ICD-10-CM

## 2015-03-03 NOTE — Progress Notes (Signed)
    Daily Group Progress Note  Program: [facility]-IOP   Group Time: 1-2:30 pm  Participation Level: Active  Behavioral Response: Sharing, negative, pessimistic  Type of Therapy: Process Group  Topic: Process; the first part of group was spent in process. Members shared what they had done to strengthen their recovery since we last met. Members mentioned meetings, talking with sponsor, and step work. They also discussed diet, exercises and other examples of good self-care. One member had returned after a week-long relapse and she admitted being scared if she goes back out. During this session, the medical director met with 2 new group members and discharged another.   Group Time: 2:45- 4pm  Participation Level: Active  Behavioral Response: Sharing  Type of Therapy: Psycho-education Group  Topic: Psycho-ed/Graduation: the second half of group today was spent in a psycho-ed.  A DVD called "The Neurobiology of Addiction" was viewed. Members discussed the information disclosed in the film and there was a good and newly obtained understanding of the chemical nature of their addiction. One member said, "Now I understand what is happening when I feel like I do". Before the session was completed, a graduation ceremony was held to honor a successfully graduating member. There were kind words of respect and caring expressed as the medallion was passed around the group. The graduation was an emotional ceremony for the member leaving [place] today.   Summary: The patient reported she had attended 2-AA meetings, including this morning in [place] where she met 3 other group members. Afterwards they had gone for a nice walk and grabbed some lunch. When I asked her about the changes in the dosages of her medications, the patient reported she was taking them as prescribed, but then reneged and admitted she was trying to manage the increase in her Depakote medication. So, in actuality, the patient is not following her new  psychiatrist's orders.  After watching the DVD, the patient reported she found it depressing. Another member challenged her that it was helpful because she could more clearly understand why she had such feelings at certain times. Right now, the patient is displaying a rather negative perspective on almost everything and her fellow group members all see this very clearly. She remains compliant in the program, but this patient displays anything but gratitude in early recovery. Her sobriety date remains 2/7.  Family Program: Family present? No   Name of family member(s):   UDS collected: No Results:   AA/NA attended?: YesThursday and Friday  Sponsor?: Yes   Refugia Laneve, LCAS

## 2015-03-04 ENCOUNTER — Encounter (HOSPITAL_COMMUNITY): Payer: Self-pay | Admitting: Psychology

## 2015-03-04 NOTE — Progress Notes (Signed)
    Daily Group Progress Note  Program: CD-IOP   Group Time: 1-2:30 pm  Participation Level: Active  Behavioral Response: Appropriate and Sharing  Type of Therapy: Process Group  Topic: Process.: The first part of group was spent in process. Members shared about their activities relative to recovery that they did over the weekend. One member checked in with a new sobriety date. After the check-in, her weekend was diagrammed on the board. Group member were able to offer good feedback about what she might have done differently to avoid relapsing. There was good disclosure among members and the member with the new sobriety date was quite receptive to the comments.   Group Time: 2:45- 4pm  Participation Level: Active  Behavioral Response: Sharing  Type of Therapy: Psycho-education Group  Topic: Pros & Cons of Using VS Sobriety/Graduation: the second half of group was spent in a psycho-ed. A grid was drawn on the board and group members invited to identify the pros and cons of active addiction versus the pros and cons of sobriety. Members easily identified many cons of drinking and drugging. They identified a number of pros as well. On the sobriety side, there were many pros, but very few cons. As the end of group approached, a graduation ceremony was held honoring a successfully departing group member. 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  There were very thoughtful and kind words shared by all.   Summary: The patient reported she had attended 3 AA meetings since last Friday. One of them had been a "speaker meeting" and she had found this woman's story to be very inspiring. She admitted that it was amazing to hear some of the terrible hardships and struggles that some of these folks have been through. In the psycho-ed, the patient identified a pro of her drinking as bringing with it a reduction in her anxiety and depression, But, on the other  hand, she admitted that an increase in her anxiety and depression as a result of her drinking is a con.  The patient admitted it was a double-edged sword. The patient had kind words of hope and gratitude to share with the member who was graduating today. She wished her well and they agreed that they would remain in touch and that she would see her in meetings. The patient responded well to this intervention and her sobriety date remains 2/7.   Family Program: Family present? No   Name of family member(s):   UDS collected: No Results:  AA/NA attended?: YesMonday, Friday and Saturday  Sponsor?: Yes   Isidor Bromell, LCAS

## 2015-03-05 ENCOUNTER — Encounter (HOSPITAL_COMMUNITY): Payer: Self-pay | Admitting: Psychology

## 2015-03-05 ENCOUNTER — Other Ambulatory Visit (HOSPITAL_COMMUNITY): Payer: 59 | Admitting: Psychology

## 2015-03-05 DIAGNOSIS — F3130 Bipolar disorder, current episode depressed, mild or moderate severity, unspecified: Secondary | ICD-10-CM

## 2015-03-05 DIAGNOSIS — F1020 Alcohol dependence, uncomplicated: Secondary | ICD-10-CM

## 2015-03-05 NOTE — Progress Notes (Signed)
    Daily Group Progress Note  Program: CD-IOP   Group Time: 1-2:30 pm  Participation Level: Active  Behavioral Response: Appropriate and Sharing  Type of Therapy: Process Group  Topic: After checking in, group members took turns sharing about recovery-related activities and challenges they faced since our last group meeting.   Group members were active during this discussion and many members engaged in feedback to other group members. Drug tests were collected from all group members. Two group members were out sick today.   Group Time: 2:45- 4pm  Participation Level: Active  Behavioral Response: Appropriate and Sharing  Type of Therapy: Psycho-education Group  Topic: The second part of group focused on "The Wheel of Life." The exercise measures the client's level of satisfaction in eight different sections or elements of life.  Together, they represent one way of describing a balanced whole life.  By drawing their wheels, members can see where they might need to apply themselves in order to find more balance. Each member drew his/her wheel on the board and shared how they came to measure their satisfaction with each category. There was good disclosure and feedback among group members.   Summary: The patient reported she had attended 2 AA meetings since the last group session. She had also met with her sponsor and they are planning on meeting once a week. The patient reported she is really struggling with anxiety and it is present almost all of the time. Another member admitted that it would be miserable to feel like that all the time. When asked about the things she is learning in her individual therapy sessions, the patient admitted she hasn't really done any of the things her counselor has taught her. I reminded her and the rest of the group members that taking a benzo is not an option and there is no magic pill. They will all learn to address their anxiety more effectively if they  practice some of the techniques and skills they are being taught in group and individual counseling sessions. Although she had identified the intention to join a gym about 4 weeks ago, I pointed out that she has still not done that, despite stating that it would really help reduce her anxiety. The patient drew her wheel on the board and explained why she had shaped her wheel this way. The patient reported her career is very unsatisfying right now as is her finances. Her relationship with her family and friends was the highest with 8 out of 10 and she admitted she was in good physical health. The patient struggles to articulate what she is experiencing clearly and is unable to describe her anxiety other than she is uncomfortable in her own skin. She has increased her engagement in [employer] and is going out to lunch with some of the women in the group before the session begins. She has remained sober, but seems very unhappy in her life right now and is, admittedly, not having any fun. The patient's sobriety date remains 2/7.   UDS collected: Yes Results: pending  AA/NA attended?: YesWednesday and Thursday  Sponsor?: Yes   Doneta Bayman, LCAS

## 2015-03-06 ENCOUNTER — Encounter (HOSPITAL_COMMUNITY): Payer: Self-pay | Admitting: Psychology

## 2015-03-07 ENCOUNTER — Encounter (HOSPITAL_COMMUNITY): Payer: Self-pay | Admitting: Medical

## 2015-03-07 ENCOUNTER — Other Ambulatory Visit (HOSPITAL_COMMUNITY): Payer: 59 | Admitting: Licensed Clinical Social Worker

## 2015-03-07 DIAGNOSIS — F3130 Bipolar disorder, current episode depressed, mild or moderate severity, unspecified: Secondary | ICD-10-CM

## 2015-03-07 DIAGNOSIS — F1020 Alcohol dependence, uncomplicated: Secondary | ICD-10-CM

## 2015-03-07 MED ORDER — ACAMPROSATE CALCIUM 333 MG PO TBEC: 666.0000 mg | DELAYED_RELEASE_TABLET | Freq: Three times a day (TID) | ORAL | Status: DC

## 2015-03-07 NOTE — Progress Notes (Signed)
Patient ID: Jessica [name], female   DOB: 08-08-1971, 44 y.o.   MRN: [id number] S-Pt referred by [facility] for c/o craving and requesting Rx for Campral.Has been seen by Dr Adele Schilder and is not taking as many meds as she had been put on before.  O-Behavior-normal/comprehensibe; Orientation-oriented X4;Affect-Flat with anxiety;Perception-Limited;Thought-logical;comprehensible Attending Group today  A- Alcohol dependence with withdrawal with complication;Bipolar 1 most recent episode depressed  P-Rx Campral sent to pharmacy;pt advised to take with meals;notify CD IOP staff if any problems arise taking medication

## 2015-03-10 ENCOUNTER — Encounter (HOSPITAL_COMMUNITY): Payer: Self-pay | Admitting: Psychology

## 2015-03-10 ENCOUNTER — Other Ambulatory Visit (HOSPITAL_COMMUNITY): Payer: 59 | Admitting: Licensed Clinical Social Worker

## 2015-03-10 DIAGNOSIS — F1020 Alcohol dependence, uncomplicated: Secondary | ICD-10-CM | POA: Diagnosis not present

## 2015-03-10 NOTE — Progress Notes (Signed)
    Daily Group Progress Note  Program: CD-IOP   Group Time: 1-2:30  Participation Level: Active  Behavioral Response: Appropriate and Sharing  Type of Therapy: Process Group  Topic: After checking in, group members took turns sharing about recovery-related activities and challenges they faced since our last group meeting.   Group members were active during this discussion and many group members engaged in feedback to other group members. Drug test results were not available due to holiday. They will be given out Monday. One group member did not show up for group and one group member was out of town for the holiday.  Three members saw the PA, Darlyne Russian.      Group Time: 2:45-4  Participation Level: Active  Behavioral Response: Appropriate and Sharing  Type of Therapy: Psycho-education Group  Topic: The second half of group focused on encouraging new ways of thinking about and responding to anger. Each patient was able to report their physical cues and emotional responses to anger. It was pointed out during group that most people cover up emotions by responding with anger. Group members identified these emotions and agreed that they do cover up other emotions with anger. They also completed a worksheet on personal triggers that led to anger and frustration.     Summary: The patient met with the PA today and received a prescription for Campral. She had requested this medication due to her continued desire to drink. She reported to the group she called the gym and is going to visit the gym this afternoon. The group was glad to hear that she had done this for her self-care. The patient went to 3 meetings since Wednesday. She went to the women's meeting in [place] this morning with other members of the group. She expressed concern that she is not working a lot of hours this weekend and needs to be making more money. She was given suggestions by the group. The patient was able to  identify that her feelings of anxiety and fear triggers her anger. The patient was an active participant in the group intervention. Her sobriety date remains 2/7.   Family Program: Family present? No   Name of family member(s):   UDS collected: Yes Results: negative  AA/NA attended?: YesWednesday, Thursday and Friday  Sponsor?: Yes   Dariah Mcsorley S, Licensed Cli

## 2015-03-11 ENCOUNTER — Ambulatory Visit (INDEPENDENT_AMBULATORY_CARE_PROVIDER_SITE_OTHER): Payer: 59 | Admitting: Psychiatry

## 2015-03-11 ENCOUNTER — Telehealth (HOSPITAL_COMMUNITY): Payer: Self-pay | Admitting: *Deleted

## 2015-03-11 ENCOUNTER — Encounter (HOSPITAL_COMMUNITY): Payer: Self-pay | Admitting: Licensed Clinical Social Worker

## 2015-03-11 ENCOUNTER — Encounter (HOSPITAL_COMMUNITY): Payer: Self-pay | Admitting: Psychiatry

## 2015-03-11 VITALS — BP 122/67 | HR 90 | Ht 68.0 in | Wt 148.4 lb

## 2015-03-11 DIAGNOSIS — F1020 Alcohol dependence, uncomplicated: Secondary | ICD-10-CM | POA: Diagnosis not present

## 2015-03-11 DIAGNOSIS — Y909 Presence of alcohol in blood, level not specified: Secondary | ICD-10-CM | POA: Diagnosis not present

## 2015-03-11 DIAGNOSIS — F3130 Bipolar disorder, current episode depressed, mild or moderate severity, unspecified: Secondary | ICD-10-CM

## 2015-03-11 MED ORDER — ASENAPINE MALEATE 5 MG SL SUBL: 5.0000 mg | SUBLINGUAL_TABLET | Freq: Every day | SUBLINGUAL | Status: DC

## 2015-03-11 MED ORDER — DIVALPROEX SODIUM 500 MG PO DR TAB: DELAYED_RELEASE_TABLET | ORAL | Status: DC

## 2015-03-11 NOTE — Progress Notes (Signed)
[facility] [phone] Progress Note  Jessica [name] [id number] 43 y.o.  03/11/2015 11:38 AM  Chief Complaint:  I still have anxiety and nervousness.  I'm going to [place].    History of Present Illness:  Jessica [name] came for her follow-up appointment.  She was seen first time on March 15 as initial evaluation .  She was referred from [facility].  She is still in the program and she has few more days to finish the program.  Patient has history of alcohol dependence and bipolar disorder.  Though she does not believe she has bipolar disorder but she admitted taking Saphris helping her mood swing irritability and agitation.  She was recently given Campral by Jessica [name] at [facility] however patient unable to get from the pharmacy because it is backorder.  Patient told she has urge and craving for drinking and she had tried Campral and disulfiram in the past.  That she is not comfortable with disulfiram but she wanted to try Campral.  She has not relapsed into drinking since she left the hospital.  She still have anxiety and nervousness but it is less frequent and less intense.  She is taking Depakote which was increased on her last visit 1500 mg a day.  She was unable to get blood work because she wanted to have it done at her primary care physician and they do not have any appointment and now she is scheduled to have it done today.  She is compliant with Saphris 5 mg daily.  She has no tremors, shakes.  She is taking Remeron 15 mg but now she is open to try 30 mg which she has done in the past without any side effects.  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  In the past she has worked as a Biomedical scientist .  Overall she is feeling better since the Depakote increase.  She sleeping better.  She denies any paranoia, hallucination, suicidal thoughts or homicidal thought.  She is living with her  father and her stepmother .  She is going to the [place] meeting and denies any drug use.  She denies any nightmares, flashback or any impulsive behavior.  She is planning to continue outpatient counseling and she had appointment with [name] .  Her appetite is okay.  Her vitals are stable.    Suicidal Ideation: No Plan Formed: No Patient has means to carry out plan: No  Homicidal Ideation: No Plan Formed: No Patient has means to carry out plan: No  Past Psychiatric History/Hospitalization(s) Patient started seeing psychiatrist at age 67.  She has at least 6-7 hospitalization mostly triggered by alcohol and drug use.  She has diagnosed bipolar disorder when she was in [place].  She denies any suicidal attempt but admitted suicidal thoughts.  She endorse history of mood swing irritability and impulsive behavior.  In the past she had tried Risperdal, Zoloft, Lamictal, Vistaril, gabapentin, trazodone, Campral and disulfiram she remember passing out with disulfiram because she was drinking while taking disulfiram.  Her last psychiatric admission was at [facility] in January 2016.  Upon discharge she attended [facility] .  She has been in various treatment Center for alcohol problem.   Anxiety: Yes Bipolar Disorder: Yes Depression: Yes Mania: No Psychosis: No Schizophrenia: No Personality Disorder: No Hospitalization for psychiatric illness: Yes History of Electroconvulsive Shock Therapy: No  Prior Suicide Attempts: No  Medical History; Patient has no active medical problem.  Her primary care physician is at [facility].  She denies any history of seizures.  Review of Systems  Constitutional: Negative.   Skin: Negative for itching and rash.  Neurological: Negative for tremors.  Psychiatric/Behavioral: Negative for suicidal ideas, hallucinations and substance abuse. The patient is nervous/anxious.    Psychiatric: Agitation: No Hallucination:  No Depressed Mood: No Insomnia: No Hypersomnia: No Altered Concentration: No Feels Worthless: No Grandiose Ideas: No Belief In Special Powers: No New/Increased Substance Abuse: No Compulsions: No  Neurologic: Headache: No Seizure: No Paresthesias: No   Musculoskeletal: Strength & Muscle Tone: within normal limits Gait & Station: normal Patient leans: N/A   Outpatient Encounter Prescriptions as of 03/11/2015  Medication Sig  . acamprosate (CAMPRAL) 333 MG tablet Take 2 tablets (666 mg total) by mouth 3 (three) times daily with meals. (Patient not taking: Reported on 03/11/2015)  . asenapine (SAPHRIS) 5 MG SUBL 24 hr tablet Place 1 tablet (5 mg total) under the tongue at bedtime.  . divalproex (DEPAKOTE) 500 MG DR tablet Take 1 tab in am and 2 at bed time  . mirtazapine (REMERON) 15 MG tablet Take 1 tablet (15 mg total) by mouth at bedtime. For depression/sleep (Patient taking differently: Take 30 mg by mouth at bedtime. For depression/sleep)  . [DISCONTINUED] asenapine (SAPHRIS) 5 MG SUBL 24 hr tablet Place 1 tablet (5 mg total) under the tongue at bedtime.  . [DISCONTINUED] divalproex (DEPAKOTE) 500 MG DR tablet Take 1 tab in am and 2 at bed time    Recent Results (from the past 2160 hour(s))  Valproic acid level     Status: Abnormal   Collection Time: 01/07/15  5:11 PM  Result Value Ref Range   Valproic Acid Lvl 35.2 (L) 50.0 - 100.0 ug/mL    Comment: Performed at [facility]  CBC WITH DIFFERENTIAL     Status: Abnormal   Collection Time: 01/07/15  5:13 PM  Result Value Ref Range   WBC 8.8 4.0 - 10.5 K/uL   RBC 4.24 3.87 - 5.11 MIL/uL   Hemoglobin 13.2 12.0 - 15.0 g/dL   HCT 40.0 36.0 - 46.0 %   MCV 94.3 78.0 - 100.0 fL   MCH 31.1 26.0 - 34.0 pg   MCHC 33.0 30.0 - 36.0 g/dL   RDW 15.7 (H) 11.5 - 15.5 %   Platelets 338 150 - 400 K/uL   Neutrophils Relative % 72 43 - 77 %   Neutro Abs 6.3 1.7 - 7.7 K/uL   Lymphocytes Relative 18 12 - 46 %   Lymphs Abs 1.5 0.7  - 4.0 K/uL   Monocytes Relative 9 3 - 12 %   Monocytes Absolute 0.8 0.1 - 1.0 K/uL   Eosinophils Relative 0 0 - 5 %   Eosinophils Absolute 0.0 0.0 - 0.7 K/uL   Basophils Relative 1 0 - 1 %   Basophils Absolute 0.1 0.0 - 0.1 K/uL  Comprehensive metabolic panel     Status: Abnormal   Collection Time: 01/07/15  5:13 PM  Result Value Ref Range   Sodium 135 135 - 145 mmol/L   Potassium 4.1 3.5 - 5.1 mmol/L   Chloride 98 96 - 112 mmol/L   CO2 24 19 - 32 mmol/L   Glucose, Bld 154 (H) 70 - 99 mg/dL   BUN 6 6 - 23 mg/dL   Creatinine, Ser 0.50 0.50 - 1.10 mg/dL   Calcium 8.5  8.4 - 10.5 mg/dL   Total Protein 6.8 6.0 - 8.3 g/dL   Albumin 4.1 3.5 - 5.2 g/dL   AST 55 (H) 0 - 37 U/L   ALT 64 (H) 0 - 35 U/L   Alkaline Phosphatase 61 39 - 117 U/L   Total Bilirubin 0.3 0.3 - 1.2 mg/dL   GFR calc non Af Amer >90 >90 mL/min   GFR calc Af Amer >90 >90 mL/min    Comment: (NOTE) The eGFR has been calculated using the CKD EPI equation. This calculation has not been validated in all clinical situations. eGFR's persistently <90 mL/min signify possible Chronic Kidney Disease.    Anion gap 13 5 - 15  Ethanol     Status: Abnormal   Collection Time: 01/07/15  5:13 PM  Result Value Ref Range   Alcohol, Ethyl (B) 278 (H) 0 - 9 mg/dL    Comment:        LOWEST DETECTABLE LIMIT FOR SERUM ALCOHOL IS 11 mg/dL FOR MEDICAL PURPOSES ONLY   Drug screen panel, emergency     Status: Abnormal   Collection Time: 01/07/15  5:29 PM  Result Value Ref Range   Opiates NONE DETECTED NONE DETECTED   Cocaine NONE DETECTED NONE DETECTED   Benzodiazepines POSITIVE (A) NONE DETECTED   Amphetamines NONE DETECTED NONE DETECTED   Tetrahydrocannabinol NONE DETECTED NONE DETECTED   Barbiturates POSITIVE (A) NONE DETECTED    Comment:        DRUG SCREEN FOR MEDICAL PURPOSES ONLY.  IF CONFIRMATION IS NEEDED FOR ANY PURPOSE, NOTIFY LAB WITHIN 5 DAYS.        LOWEST DETECTABLE LIMITS FOR URINE DRUG SCREEN Drug Class        Cutoff (ng/mL) Amphetamine      1000 Barbiturate      200 Benzodiazepine   245 Tricyclics       809 Opiates          300 Cocaine          300 THC              50   TSH     Status: None   Collection Time: 01/08/15  7:36 PM  Result Value Ref Range   TSH 2.858 0.350 - 4.500 uIU/mL    Comment: Performed at [facility]  Lipid panel     Status: None   Collection Time: 01/08/15  7:36 PM  Result Value Ref Range   Cholesterol 158 0 - 200 mg/dL   Triglycerides 83 <150 mg/dL   HDL 85 >39 mg/dL   Total CHOL/HDL Ratio 1.9 RATIO   VLDL 17 0 - 40 mg/dL   LDL Cholesterol 56 0 - 99 mg/dL    Comment:        Total Cholesterol/HDL:CHD Risk Coronary Heart Disease Risk Table                     Men   Women  1/2 Average Risk   3.4   3.3  Average Risk       5.0   4.4  2 X Average Risk   9.6   7.1  3 X Average Risk  23.4   11.0        Use the calculated Patient Ratio above and the CHD Risk Table to determine the patient's CHD Risk.        ATP III CLASSIFICATION (LDL):  <100     mg/dL   Optimal  100-129  mg/dL   Near or Above                    Optimal  130-159  mg/dL   Borderline  160-189  mg/dL   High  >190     mg/dL   Very High Performed at [facility]   CBC WITH DIFFERENTIAL     Status: Abnormal   Collection Time: 01/12/15 11:45 PM  Result Value Ref Range   WBC 8.1 4.0 - 10.5 K/uL   RBC 3.48 (L) 3.87 - 5.11 MIL/uL   Hemoglobin 11.1 (L) 12.0 - 15.0 g/dL   HCT 33.6 (L) 36.0 - 46.0 %   MCV 96.6 78.0 - 100.0 fL   MCH 31.9 26.0 - 34.0 pg   MCHC 33.0 30.0 - 36.0 g/dL   RDW 15.6 (H) 11.5 - 15.5 %   Platelets 215 150 - 400 K/uL   Neutrophils Relative % 55 43 - 77 %   Neutro Abs 4.5 1.7 - 7.7 K/uL   Lymphocytes Relative 38 12 - 46 %   Lymphs Abs 3.1 0.7 - 4.0 K/uL   Monocytes Relative 5 3 - 12 %   Monocytes Absolute 0.4 0.1 - 1.0 K/uL   Eosinophils Relative 1 0 - 5 %   Eosinophils Absolute 0.1 0.0 - 0.7 K/uL   Basophils Relative 1 0 - 1 %   Basophils Absolute 0.1  0.0 - 0.1 K/uL  Comprehensive metabolic panel     Status: Abnormal   Collection Time: 01/12/15 11:45 PM  Result Value Ref Range   Sodium 141 135 - 145 mmol/L   Potassium 3.8 3.5 - 5.1 mmol/L   Chloride 108 96 - 112 mmol/L   CO2 26 19 - 32 mmol/L   Glucose, Bld 84 70 - 99 mg/dL   BUN 7 6 - 23 mg/dL   Creatinine, Ser 0.48 (L) 0.50 - 1.10 mg/dL   Calcium 8.1 (L) 8.4 - 10.5 mg/dL   Total Protein 5.9 (L) 6.0 - 8.3 g/dL   Albumin 3.5 3.5 - 5.2 g/dL   AST 12 0 - 37 U/L   ALT 20 0 - 35 U/L   Alkaline Phosphatase 43 39 - 117 U/L   Total Bilirubin 0.3 0.3 - 1.2 mg/dL   GFR calc non Af Amer >90 >90 mL/min   GFR calc Af Amer >90 >90 mL/min    Comment: (NOTE) The eGFR has been calculated using the CKD EPI equation. This calculation has not been validated in all clinical situations. eGFR's persistently <90 mL/min signify possible Chronic Kidney Disease.    Anion gap 7 5 - 15  Ethanol     Status: Abnormal   Collection Time: 01/12/15 11:45 PM  Result Value Ref Range   Alcohol, Ethyl (B) 363 (H) 0 - 9 mg/dL    Comment:        LOWEST DETECTABLE LIMIT FOR SERUM ALCOHOL IS 11 mg/dL FOR MEDICAL PURPOSES ONLY   Salicylate level     Status: None   Collection Time: 01/12/15 11:45 PM  Result Value Ref Range   Salicylate Lvl <3.1 2.8 - 20.0 mg/dL  Acetaminophen level     Status: Abnormal   Collection Time: 01/12/15 11:45 PM  Result Value Ref Range   Acetaminophen (Tylenol), Serum <10.0 (L) 10 - 30 ug/mL    Comment:        THERAPEUTIC CONCENTRATIONS VARY SIGNIFICANTLY. A RANGE OF 10-30 ug/mL MAY BE AN EFFECTIVE CONCENTRATION FOR MANY PATIENTS. HOWEVER, SOME ARE  BEST TREATED AT CONCENTRATIONS OUTSIDE THIS RANGE. ACETAMINOPHEN CONCENTRATIONS >150 ug/mL AT 4 HOURS AFTER INGESTION AND >50 ug/mL AT 12 HOURS AFTER INGESTION ARE OFTEN ASSOCIATED WITH TOXIC REACTIONS.   Drug screen panel, emergency     Status: Abnormal   Collection Time: 01/13/15  3:22 AM  Result Value Ref Range   Opiates  NONE DETECTED NONE DETECTED   Cocaine NONE DETECTED NONE DETECTED   Benzodiazepines POSITIVE (A) NONE DETECTED   Amphetamines NONE DETECTED NONE DETECTED   Tetrahydrocannabinol NONE DETECTED NONE DETECTED   Barbiturates POSITIVE (A) NONE DETECTED    Comment:        DRUG SCREEN FOR MEDICAL PURPOSES ONLY.  IF CONFIRMATION IS NEEDED FOR ANY PURPOSE, NOTIFY LAB WITHIN 5 DAYS.        LOWEST DETECTABLE LIMITS FOR URINE DRUG SCREEN Drug Class       Cutoff (ng/mL) Amphetamine      1000 Barbiturate      200 Benzodiazepine   024 Tricyclics       097 Opiates          300 Cocaine          300 THC              50   I-Stat CG4 Lactic Acid, ED     Status: None   Collection Time: 01/13/15  5:17 AM  Result Value Ref Range   Lactic Acid, Venous 1.11 0.5 - 2.0 mmol/L  I-stat chem 8, ed     Status: Abnormal   Collection Time: 01/13/15  8:11 AM  Result Value Ref Range   Sodium 148 (H) 135 - 145 mmol/L   Potassium 4.3 3.5 - 5.1 mmol/L   Chloride 113 (H) 96 - 112 mmol/L   BUN <3 (L) 6 - 23 mg/dL   Creatinine, Ser 0.80 0.50 - 1.10 mg/dL   Glucose, Bld 75 70 - 99 mg/dL   Calcium, Ion 1.00 (L) 1.12 - 1.23 mmol/L   TCO2 21 0 - 100 mmol/L   Hemoglobin 12.2 12.0 - 15.0 g/dL   HCT 36.0 36.0 - 46.0 %      Constitutional:  BP 122/67 mmHg  Pulse 90  Ht '5\' 8"'  (1.727 m)  Wt 148 lb 6.4 oz (67.314 kg)  BMI 22.57 kg/m2   Mental Status Examination;  Patient is groomed well dressed female who appears anxious but cooperative.  Her speech is fast but clear and coherent.  She described her mood nervous and her affect is mood appropriate.  She denies any auditory or visual hallucination.  Her attention concentration is fair.  There were no delusions, paranoid thinking or any obsessive thoughts.  She denies any paranoia or any obsessive thoughts   She has no tremors or shakes.  Her fund of knowledge is good.  Her cognition is good.  She is alert and oriented 3.  Her insight judgment and impulse control is  okay.   Established Problem, Stable/Improving (1), Review of Psycho-Social Stressors (1), Review or order clinical lab tests (1), Review of Last Therapy Session (1), Independent Review of image, tracing or specimen (2), Review of Medication Regimen & Side Effects (2) and Review of New Medication or Change in Dosage (2)  Assessment: Axis I: Bipolar disorder depressed type, alcohol dependence  Axis II: Deferred  Axis III:  Past Medical History  Diagnosis Date  . Anxiety   . Bipolar 1 disorder      Plan:  Patient shown some improvement from increased Depakote however  she does not have Depakote level.  I will provide a hard copy of Depakote level so she can have blood work at her primary care physician .  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  She has done at Mr[employer] to work in the past and she agreed that she will apply for different job .  I called pharmacy and they do not have Campral until mid June.  Patient does not want to take any other medication since she had tried disulfiram that cause passout .  She told that she can wait until mid June.  She still have anxiety and nervousness and I recommended to try Remeron 30 mg at bedtime.  Patient denies any side effects of medication.  Continue Saphris 5 mg daily, Depakote 500 mg in the morning and thousand milligram at bedtime .  [facility] to continue counseling .  Patient has appointment with Jessica [name] for counseling.  Discussed medication side effects and benefits.  Recommended to call us back if she has any question or any concern.  Follow-up in 4 weeks.  Time spent 25 minutes.  More than 50% of the time spent in psychoeducation, counseling and coordination of care.  Discuss safety plan that anytime having active suicidal thoughts or homicidal thoughts then patient need to call 911 or go to the local emergency room.    ARFEEN,SYED T.,  MD 03/11/2015

## 2015-03-11 NOTE — Progress Notes (Signed)
Jessica [name] is a 44 y.o. female patient. [facility]: Individual Counseling Session. I met with this patient at the conclusion of her group session this afternoon. She reported she is doing better now that she is not so anxious. She couldn't really explain why or how her anxiety has lessened, but reported she is taking her medications as prescribed. The patient admitted she had not yet picked up her script for Campral. I noted she had been very adamant about getting that prescription from the program director on Friday and yet here it was Monday, and she still hadn't gone to the drugstore to pick it up. The patient had had plenty of time and had not really done anything all day Saturday, but lay around. I pointed out this didn't look like motivation to me. The patient assured me she would go by later today or tomorrow. I encouraged her to phone the drug store first before she goes. The patient reported she was meeting with Dr. Adele Schilder tomorrow at 11 am. She admitted she is embarrassed because she hasn't gotten the labs drawn to determine her Depakote levels. I urged her to phone and schedule an appointment with her PCP and at least be able to tell her psychiatrist she has a blood draw scheduled. I applauded the patient being more active about attending AA meetings and coming to the [place] meeting when she can to be with some of the other female members who go to the meeting in the morning before our program sessions. The patient reported she wants to go back to [facility] after she has saved up enough money to get a place to live. She noted her parents aren't requiring her to pay anything so she has a good opportunity to save money. I reminded her that can move back to [facility] and start her new life and she can even spend a few minutes each day imagining what that life will look like. But first and foremost, I encouraged this patient to focus on today and building a strong foundation for her recovery. I also  reminded her that if she didn't want to join a gym then she shouldn't. She had shared in group that the gym, which is about 3 blocks from her parent's home, costs $35 per month and she felt as if that was too expensive. But, she quickly countered her own statement and, she admitted that she didn't really have any other expenses and going to the gym is "good for me". The session ended and the patient stated she would return tomorrow to meet her psychiatrist and provide an update. The patient is progressing fairly well in her recovery and her sobriety date remains 2/7.         Jisselle Poth, LCAS

## 2015-03-11 NOTE — Progress Notes (Signed)
    Daily Group Progress Note  Program: [facility]   Group Time: 1-2:30  Participation Level: Active  Behavioral Response: Appropriate and Sharing  Type of Therapy: Process Group  Topic: After checking in, group members took turns sharing about recovery-related activities and challenges they faced since our last group meeting.   Group members were active during this discussion and many group members engaged in feedback to other group members. Drug test results were given out. New drug tests were given out to each group member. Two group members reported relapse and good suggestions and feedback was given to those group members.       Group Time: 2:45-4  Participation Level: Active  Behavioral Response: Appropriate and Sharing  Type of Therapy: Psycho-education Group  Topic: The second part of the group focused on the 4 stages of relapse. Patients identified the relapse process begins with a trigger>thought>craving>use. The only way to ensure that a thought won't lead to a relapse is to stop the thought before it leads to a craving. Each patient identified their personal internal and external triggers.     Summary: Patient reported she went to 3 meetings on Friday and Saturday. She bought the 12 steps and 12 traditions and read some of that over the weekend. The patient reported that she did visit the gym but did not join because it was so expensive. Other group members gave her suggestions about gym memberships. She listened to the suggestions. The patient reported she worked a double on Sunday. The patient reported she did not pick up her Campral medication but will pick it up from the pharmacy today. The patient participated in the 4 stages of the relapse process but did not identify any personal triggers. She did give good suggestions and feedback to the 2 patients who relapsed. Her sobriety date remains 2/7.   Family Program: Family present? No   Name of family member(s):   UDS  collected: Yes Results: Pending  AA/NA attended?: YesMonday, Friday and Saturday  Sponsor?: Yes   MACKENZIE,LISBETH S, Licensed Cli

## 2015-03-11 NOTE — Telephone Encounter (Signed)
Jessica [name],   Received via fax from [facility] that Campral is on manufacture back order. Patient was made aware of this by pharmacy.  I contacted [facility] and spoke with Marlowe Kays.  Per Marlowe Kays they did not give how long medication will be on back order, note from manufacture just stated unavailable on back order.   Do you want to send something else to the pharmacy for the patient?

## 2015-03-12 ENCOUNTER — Encounter: Payer: Self-pay | Admitting: Psychiatry

## 2015-03-12 ENCOUNTER — Other Ambulatory Visit (HOSPITAL_COMMUNITY): Payer: 59 | Admitting: Psychology

## 2015-03-12 DIAGNOSIS — F1020 Alcohol dependence, uncomplicated: Secondary | ICD-10-CM

## 2015-03-12 DIAGNOSIS — F3130 Bipolar disorder, current episode depressed, mild or moderate severity, unspecified: Secondary | ICD-10-CM

## 2015-03-13 ENCOUNTER — Encounter (HOSPITAL_COMMUNITY): Payer: Self-pay | Admitting: Psychology
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
## 2015-03-14 ENCOUNTER — Other Ambulatory Visit (HOSPITAL_COMMUNITY): Payer: 59 | Attending: Psychiatry | Admitting: Psychology

## 2015-03-16 ENCOUNTER — Encounter (HOSPITAL_COMMUNITY): Payer: Self-pay | Admitting: Psychology
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
## 2015-03-17 ENCOUNTER — Encounter (HOSPITAL_COMMUNITY): Payer: Self-pay | Admitting: Psychology

## 2015-03-17 ENCOUNTER — Other Ambulatory Visit (HOSPITAL_COMMUNITY): Payer: 59 | Admitting: Licensed Clinical Social Worker

## 2015-03-17 DIAGNOSIS — F1020 Alcohol dependence, uncomplicated: Secondary | ICD-10-CM

## 2015-03-18 ENCOUNTER — Ambulatory Visit (INDEPENDENT_AMBULATORY_CARE_PROVIDER_SITE_OTHER): Payer: 59 | Admitting: Psychiatry

## 2015-03-18 ENCOUNTER — Encounter (HOSPITAL_COMMUNITY): Payer: Self-pay | Admitting: Psychiatry

## 2015-03-18 ENCOUNTER — Encounter (HOSPITAL_COMMUNITY): Payer: Self-pay | Admitting: Licensed Clinical Social Worker

## 2015-03-18 VITALS — BP 122/80 | HR 92 | Ht 68.0 in | Wt 146.4 lb

## 2015-03-18 DIAGNOSIS — F3130 Bipolar disorder, current episode depressed, mild or moderate severity, unspecified: Secondary | ICD-10-CM | POA: Diagnosis not present

## 2015-03-18 DIAGNOSIS — F1020 Alcohol dependence, uncomplicated: Secondary | ICD-10-CM | POA: Diagnosis not present

## 2015-03-18 MED ORDER — SERTRALINE HCL 25 MG PO TABS: 25.0000 mg | ORAL_TABLET | Freq: Every day | ORAL | Status: AC

## 2015-03-18 NOTE — Progress Notes (Signed)
Jessica [name] is a 44 y.o. female patient. CD-IOP: Individual Counseling Session. I met with this patient after group today. We have been meeting every Monday after group, but today, as the group session ended, I asked the patient if she would be staying to meet with me, and she reported she was too anxious and wanted to get home. I wondered if she would be okay to drive and she stated that she was fine to drive. A few minutes later, as I was straightening up the group room, the patient returned and apologized. She stated she would meet with me. This apologizing for every little thing has become a standard response for this patient. I pointed out that she is apologizing to everyone about all kinds of inappropriate or minuscule things that she needn't apologize for. She reported she knew this, but continues to do it. During our session, I asked about the things she is doing to address her anxiety. The patient admitted she isn't practicing any of the techniques or relaxation skills she has learned from our program or her private counselor. I reminded her that medication is very helpful, but it doesn't take care of the problem entirely and there are behavioral changes that must be included to, in her case, minimize her level of anxiety. The patient reported she wanted to try the Zoloft and expressed frustration that Dr. Adele Schilder had not prescribed it as they had previously discussed. I agreed to leave him a note detailing her concerns. The patient reported the results of her blood draw had indicated her levels were too high from the Depakote. I had prepared a consent and explained that [facility] in [place] was not a part of [facility] so we would have to request the records. The patient reported she had seen the level on her "my chart" online. I wondered if she would be willing to use my computer, go onto her "my chart" and we could print it out and I could give it to [place] tomorrow morning. The patient made excuses  and seemed as if this was a big inconvenience. I challenged her on this because she seems to display this attitude quite often when she receives requests or feedback in either group or with me in individual counseling sessions. She acts as if any little thing is just too much work or very inconvenient. Her unwillingness to make an effort beyond the minimum appears to becoming her "new normal". I agreed to leave a note and the copy of her lab test result in his box and we will discuss the patient tomorrow. We will continue to follow her, but she will have attended 24 sessions as of Wednesday and would normally be scheduled to graduate. The patient has expressed fears about leaving the program and we will discuss allowing her to stay, but only if she will begin to apply herself in ways not previously observed. The patient's sobriety date remains 2/7.         Eain Mullendore, LCAS

## 2015-03-18 NOTE — Progress Notes (Signed)
[facility] [phone] Progress Note  Jessica [name] [id number] 43 y.o.  03/18/2015 2:21 PM  Chief Complaint:  I have a lot of anxiety and nervousness.   History of Present Illness:  [name] came earlier than her scheduled appointment.  She was referred from Brandon Melnick from [facility] IOP as patient is increasingly complaining of anxiety and frustration.  We have recommended to increase Depakote and Remeron on her last visit.  First few days she does feel better but then she started to get more anxious and nervous and irritable.  Today she walk out from her job because she was very anxious and she felt that she was rude to the customer.  She also complaining of increase in OCD symptoms.  She is keep washing her hands and does not believe that counseling with Carie Caddy is helping her.  She is taking Remeron 15 mg at bedtime and some days she takes another 15 mg so she can sleep.  She is taking Depakote 500 mg in the morning and 1000 mg at bedtime.  Her Depakote level was 98 which was done on March 29.  She notices irritability, nervousness, and ritual thoughts in her mind.  She really wants to try Zoloft she believed helped her in the past.  She is also compliant with Saphris.  She was able to find Campral but she is not consistent with compliance.  She denies drinking but she continues to have urged to go back drinking.  Patient is living with her parents.  She denies any paranoia, hallucination, suicidal thoughts or homicidal thought.  Her appetite is okay.  Her vitals are stable.  Suicidal Ideation: No Plan Formed: No Patient has means to carry out plan: No  Homicidal Ideation: No Plan Formed: No Patient has means to carry out plan: No  Past Psychiatric History/Hospitalization(s) Patient started seeing psychiatrist at age 87.  She has at least 6-7 hospitalization mostly triggered by alcohol and drug use.  She has diagnosed bipolar disorder when she was in [place].  She denies any  suicidal attempt but admitted suicidal thoughts.  She endorse history of mood swing irritability and impulsive behavior.  In the past she had tried Risperdal, Zoloft, Lamictal, Vistaril, gabapentin, trazodone, Campral and disulfiram she remember passing out with disulfiram because she was drinking while taking disulfiram.  Her last psychiatric admission was at behavioral [facility] in January 2016.  Upon discharge she attended [facility] IOP .  She has been in various treatment Center for alcohol problem.   Anxiety: Yes Bipolar Disorder: Yes Depression: Yes Mania: No Psychosis: No Schizophrenia: No Personality Disorder: No Hospitalization for psychiatric illness: Yes History of Electroconvulsive Shock Therapy: No Prior Suicide Attempts: No  Medical History; Patient has no active medical problem.  Her primary care physician is at [facility].  She denies any history of seizures.  Review of Systems  Constitutional: Positive for malaise/fatigue.  Neurological: Negative for tremors.  Psychiatric/Behavioral: Positive for depression. Negative for suicidal ideas, hallucinations and substance abuse. The patient is nervous/anxious.    Psychiatric: Agitation: Irritability Hallucination: No Depressed Mood: Yes Insomnia: No Hypersomnia: No Altered Concentration: No Feels Worthless: No Grandiose Ideas: No Belief In Special Powers: No New/Increased Substance Abuse: No Compulsions: No  Neurologic: Headache: No Seizure: No Paresthesias: No   Musculoskeletal: Strength & Muscle Tone: within normal limits Gait & Station: normal Patient leans: N/A   Outpatient Encounter Prescriptions as of 03/18/2015  Medication Sig  . acamprosate (CAMPRAL) 333 MG tablet Take 2 tablets (  666 mg total) by mouth 3 (three) times daily with meals.  Marland Kitchen asenapine (SAPHRIS) 5 MG SUBL 24 hr tablet Place 1 tablet (5 mg total) under the tongue at bedtime.  . divalproex (DEPAKOTE) 500 MG DR tablet Take 1 tab in am and 2  at bed time  . mirtazapine (REMERON) 15 MG tablet Take 1 tablet (15 mg total) by mouth at bedtime. For depression/sleep (Patient taking differently: Take 30 mg by mouth at bedtime. For depression/sleep)  . sertraline (ZOLOFT) 25 MG tablet Take 1 tablet (25 mg total) by mouth daily.    Recent Results (from the past 2160 hour(s))  Valproic acid level     Status: Abnormal   Collection Time: 01/07/15  5:11 PM  Result Value Ref Range   Valproic Acid Lvl 35.2 (L) 50.0 - 100.0 ug/mL    Comment: Performed at [facility]  CBC WITH DIFFERENTIAL     Status: Abnormal   Collection Time: 01/07/15  5:13 PM  Result Value Ref Range   WBC 8.8 4.0 - 10.5 K/uL   RBC 4.24 3.87 - 5.11 MIL/uL   Hemoglobin 13.2 12.0 - 15.0 g/dL   HCT 40.0 36.0 - 46.0 %   MCV 94.3 78.0 - 100.0 fL   MCH 31.1 26.0 - 34.0 pg   MCHC 33.0 30.0 - 36.0 g/dL   RDW 15.7 (H) 11.5 - 15.5 %   Platelets 338 150 - 400 K/uL   Neutrophils Relative % 72 43 - 77 %   Neutro Abs 6.3 1.7 - 7.7 K/uL   Lymphocytes Relative 18 12 - 46 %   Lymphs Abs 1.5 0.7 - 4.0 K/uL   Monocytes Relative 9 3 - 12 %   Monocytes Absolute 0.8 0.1 - 1.0 K/uL   Eosinophils Relative 0 0 - 5 %   Eosinophils Absolute 0.0 0.0 - 0.7 K/uL   Basophils Relative 1 0 - 1 %   Basophils Absolute 0.1 0.0 - 0.1 K/uL  Comprehensive metabolic panel     Status: Abnormal   Collection Time: 01/07/15  5:13 PM  Result Value Ref Range   Sodium 135 135 - 145 mmol/L   Potassium 4.1 3.5 - 5.1 mmol/L   Chloride 98 96 - 112 mmol/L   CO2 24 19 - 32 mmol/L   Glucose, Bld 154 (H) 70 - 99 mg/dL   BUN 6 6 - 23 mg/dL   Creatinine, Ser 0.50 0.50 - 1.10 mg/dL   Calcium 8.5 8.4 - 10.5 mg/dL   Total Protein 6.8 6.0 - 8.3 g/dL   Albumin 4.1 3.5 - 5.2 g/dL   AST 55 (H) 0 - 37 U/L   ALT 64 (H) 0 - 35 U/L   Alkaline Phosphatase 61 39 - 117 U/L   Total Bilirubin 0.3 0.3 - 1.2 mg/dL   GFR calc non Af Amer >90 >90 mL/min   GFR calc Af Amer >90 >90 mL/min    Comment: (NOTE) The eGFR  has been calculated using the CKD EPI equation. This calculation has not been validated in all clinical situations. eGFR's persistently <90 mL/min signify possible Chronic Kidney Disease.    Anion gap 13 5 - 15  Ethanol     Status: Abnormal   Collection Time: 01/07/15  5:13 PM  Result Value Ref Range   Alcohol, Ethyl (B) 278 (H) 0 - 9 mg/dL    Comment:        LOWEST DETECTABLE LIMIT FOR SERUM ALCOHOL IS 11 mg/dL FOR MEDICAL PURPOSES ONLY  Drug screen panel, emergency     Status: Abnormal   Collection Time: 01/07/15  5:29 PM  Result Value Ref Range   Opiates NONE DETECTED NONE DETECTED   Cocaine NONE DETECTED NONE DETECTED   Benzodiazepines POSITIVE (A) NONE DETECTED   Amphetamines NONE DETECTED NONE DETECTED   Tetrahydrocannabinol NONE DETECTED NONE DETECTED   Barbiturates POSITIVE (A) NONE DETECTED    Comment:        DRUG SCREEN FOR MEDICAL PURPOSES ONLY.  IF CONFIRMATION IS NEEDED FOR ANY PURPOSE, NOTIFY LAB WITHIN 5 DAYS.        LOWEST DETECTABLE LIMITS FOR URINE DRUG SCREEN Drug Class       Cutoff (ng/mL) Amphetamine      1000 Barbiturate      200 Benzodiazepine   559 Tricyclics       741 Opiates          300 Cocaine          300 THC              50   TSH     Status: None   Collection Time: 01/08/15  7:36 PM  Result Value Ref Range   TSH 2.858 0.350 - 4.500 uIU/mL    Comment: Performed at [facility]  Lipid panel     Status: None   Collection Time: 01/08/15  7:36 PM  Result Value Ref Range   Cholesterol 158 0 - 200 mg/dL   Triglycerides 83 <150 mg/dL   HDL 85 >39 mg/dL   Total CHOL/HDL Ratio 1.9 RATIO   VLDL 17 0 - 40 mg/dL   LDL Cholesterol 56 0 - 99 mg/dL    Comment:        Total Cholesterol/HDL:CHD Risk Coronary Heart Disease Risk Table                     Men   Women  1/2 Average Risk   3.4   3.3  Average Risk       5.0   4.4  2 X Average Risk   9.6   7.1  3 X Average Risk  23.4   11.0        Use the calculated Patient Ratio above and  the CHD Risk Table to determine the patient's CHD Risk.        ATP III CLASSIFICATION (LDL):  <100     mg/dL   Optimal  100-129  mg/dL   Near or Above                    Optimal  130-159  mg/dL   Borderline  160-189  mg/dL   High  >190     mg/dL   Very High Performed at [facility]   CBC WITH DIFFERENTIAL     Status: Abnormal   Collection Time: 01/12/15 11:45 PM  Result Value Ref Range   WBC 8.1 4.0 - 10.5 K/uL   RBC 3.48 (L) 3.87 - 5.11 MIL/uL   Hemoglobin 11.1 (L) 12.0 - 15.0 g/dL   HCT 33.6 (L) 36.0 - 46.0 %   MCV 96.6 78.0 - 100.0 fL   MCH 31.9 26.0 - 34.0 pg   MCHC 33.0 30.0 - 36.0 g/dL   RDW 15.6 (H) 11.5 - 15.5 %   Platelets 215 150 - 400 K/uL   Neutrophils Relative % 55 43 - 77 %   Neutro Abs 4.5 1.7 - 7.7 K/uL   Lymphocytes  Relative 38 12 - 46 %   Lymphs Abs 3.1 0.7 - 4.0 K/uL   Monocytes Relative 5 3 - 12 %   Monocytes Absolute 0.4 0.1 - 1.0 K/uL   Eosinophils Relative 1 0 - 5 %   Eosinophils Absolute 0.1 0.0 - 0.7 K/uL   Basophils Relative 1 0 - 1 %   Basophils Absolute 0.1 0.0 - 0.1 K/uL  Comprehensive metabolic panel     Status: Abnormal   Collection Time: 01/12/15 11:45 PM  Result Value Ref Range   Sodium 141 135 - 145 mmol/L   Potassium 3.8 3.5 - 5.1 mmol/L   Chloride 108 96 - 112 mmol/L   CO2 26 19 - 32 mmol/L   Glucose, Bld 84 70 - 99 mg/dL   BUN 7 6 - 23 mg/dL   Creatinine, Ser 0.48 (L) 0.50 - 1.10 mg/dL   Calcium 8.1 (L) 8.4 - 10.5 mg/dL   Total Protein 5.9 (L) 6.0 - 8.3 g/dL   Albumin 3.5 3.5 - 5.2 g/dL   AST 12 0 - 37 U/L   ALT 20 0 - 35 U/L   Alkaline Phosphatase 43 39 - 117 U/L   Total Bilirubin 0.3 0.3 - 1.2 mg/dL   GFR calc non Af Amer >90 >90 mL/min   GFR calc Af Amer >90 >90 mL/min    Comment: (NOTE) The eGFR has been calculated using the CKD EPI equation. This calculation has not been validated in all clinical situations. eGFR's persistently <90 mL/min signify possible Chronic Kidney Disease.    Anion gap 7 5 - 15   Ethanol     Status: Abnormal   Collection Time: 01/12/15 11:45 PM  Result Value Ref Range   Alcohol, Ethyl (B) 363 (H) 0 - 9 mg/dL    Comment:        LOWEST DETECTABLE LIMIT FOR SERUM ALCOHOL IS 11 mg/dL FOR MEDICAL PURPOSES ONLY   Salicylate level     Status: None   Collection Time: 01/12/15 11:45 PM  Result Value Ref Range   Salicylate Lvl <5.0 2.8 - 20.0 mg/dL  Acetaminophen level     Status: Abnormal   Collection Time: 01/12/15 11:45 PM  Result Value Ref Range   Acetaminophen (Tylenol), Serum <10.0 (L) 10 - 30 ug/mL    Comment:        THERAPEUTIC CONCENTRATIONS VARY SIGNIFICANTLY. A RANGE OF 10-30 ug/mL MAY BE AN EFFECTIVE CONCENTRATION FOR MANY PATIENTS. HOWEVER, SOME ARE BEST TREATED AT CONCENTRATIONS OUTSIDE THIS RANGE. ACETAMINOPHEN CONCENTRATIONS >150 ug/mL AT 4 HOURS AFTER INGESTION AND >50 ug/mL AT 12 HOURS AFTER INGESTION ARE OFTEN ASSOCIATED WITH TOXIC REACTIONS.   Drug screen panel, emergency     Status: Abnormal   Collection Time: 01/13/15  3:22 AM  Result Value Ref Range   Opiates NONE DETECTED NONE DETECTED   Cocaine NONE DETECTED NONE DETECTED   Benzodiazepines POSITIVE (A) NONE DETECTED   Amphetamines NONE DETECTED NONE DETECTED   Tetrahydrocannabinol NONE DETECTED NONE DETECTED   Barbiturates POSITIVE (A) NONE DETECTED    Comment:        DRUG SCREEN FOR MEDICAL PURPOSES ONLY.  IF CONFIRMATION IS NEEDED FOR ANY PURPOSE, NOTIFY LAB WITHIN 5 DAYS.        LOWEST DETECTABLE LIMITS FOR URINE DRUG SCREEN Drug Class       Cutoff (ng/mL) Amphetamine      1000 Barbiturate      200 Benzodiazepine   354 Tricyclics       656  Opiates          300 Cocaine          300 THC              50   I-Stat CG4 Lactic Acid, ED     Status: None   Collection Time: 01/13/15  5:17 AM  Result Value Ref Range   Lactic Acid, Venous 1.11 0.5 - 2.0 mmol/L  I-stat chem 8, ed     Status: Abnormal   Collection Time: 01/13/15  8:11 AM  Result Value Ref Range   Sodium  148 (H) 135 - 145 mmol/L   Potassium 4.3 3.5 - 5.1 mmol/L   Chloride 113 (H) 96 - 112 mmol/L   BUN <3 (L) 6 - 23 mg/dL   Creatinine, Ser 0.80 0.50 - 1.10 mg/dL   Glucose, Bld 75 70 - 99 mg/dL   Calcium, Ion 1.00 (L) 1.12 - 1.23 mmol/L   TCO2 21 0 - 100 mmol/L   Hemoglobin 12.2 12.0 - 15.0 g/dL   HCT 36.0 36.0 - 46.0 %      Constitutional:  BP 122/80 mmHg  Pulse 92  Ht '5\' 8"'  (1.727 m)  Wt 146 lb 6.4 oz (66.407 kg)  BMI 22.27 kg/m2   Mental Status Examination;  Patient is groomed well dressed female who appears anxious but cooperative.  Her speech is fast but clear and coherent.  She described her mood nervous and her affect is mood appropriate.  She denies any auditory or visual hallucination.  Her attention concentration is fair.  There were no delusions or paranoid thinking.  She has obsessive thoughts about contamination.  She denies any paranoia or any obsessive thoughts   She has no tremors or shakes.  Her fund of knowledge is good.  Her cognition is good.  She is alert and oriented 3.  Her insight judgment and impulse control is okay.   Established Problem, Stable/Improving (1), Review of Psycho-Social Stressors (1), Review or order clinical lab tests (1), Established Problem, Worsening (2), Review of Last Therapy Session (1), Review of Medication Regimen & Side Effects (2) and Review of New Medication or Change in Dosage (2)  Assessment: Axis I: Bipolar disorder depressed type, alcohol dependence  Axis II: Deferred  Axis III:  Past Medical History  Diagnosis Date  . Anxiety   . Bipolar 1 disorder      Plan:  Reassurance given.  Reviewed Depakote level is 98.  At this time patient does not have any toxicity from Depakote.  She like to restart Zoloft which she has taken in the past with good response.  We discussed that some time antidepressant may facilitate mania .  We'll start low-dose Zoloft 25 mg daily.  We talked about switching her Saphris and to try Seroquel but  patient is very reluctant to stop Saphris.  For now I will continue Saphris 5 mg at bedtime, recommended to take Remeron 30 mg at bedtime and avoid taking due to the day due to sedation.  Also encouraged to take Campral which will help her craving of alcohol.  Continue Depakote thousand milligram in the night and 1500 mg in the morning.  Discussed medication side effects and benefits.  Encouraged to continue [facility] IOP program.  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  She will look into something else went her anxiety is under control.  Recommended to call us back if she has any question or any concern.  I will see her again in one week. Time spent 25 minutes.  More than 50% of the time spent in psychoeducation, counseling and coordination of care.  Discuss safety plan that anytime having active suicidal thoughts or homicidal thoughts then patient need to call 911 or go to the local emergency room.    Shandell Jallow T., MD 03/18/2015

## 2015-03-18 NOTE — Progress Notes (Signed)
    Daily Group Progress Note  Program: CD-IOP   Group Time: 1-2:30  Participation Level: Active  Behavioral Response: Appropriate and Sharing  Type of Therapy: Process Group  Topic: After checking in, group members took turns sharing about recovery-related activities and challenges they faced since our last group meeting.   Group members were active during this discussion and many group members engaged in feedback to other group members. Drug tests were given out to each group member. Two group members were absent. One group member went back to work today and another group member is sick.     Group Time: 2:45-4  Participation Level: Active  Behavioral Response: Appropriate and Sharing  Type of Therapy: Psycho-education Group  Topic: The second part of group was about emotional buttons. Emotional buttons are related to our social needs. We all want to be loved, accepted and approved by others. These are normal healthy human needs. However, when these needs become too intense, they are no longer healthy and can overtake our lives. People that are closest to [place] know how to push our buttons. Each group participant was given a handout on the emotional buttons and each patient was able to identify their personal emotional buttons. Group members appeared very honest in this activity.   Summary: Patient reported she worked Friday night so wasn't able to go to her normal meeting. On Saturday morning she went to a women's meeting and then went to another meeting on Saturday night. On Sunday she worked and then went to a meeting Sunday night. She called her sponsor over the weekend. She finally started her Campral today. She was applauded by the group members for starting her medication. This morning she met other group members to attend a meeting, lunch and shopping before group. The group member came in 20 minutes late and apologized. The patient was able to identify her emotional buttons. Her  first button was abandonment. She reports she doesn't let people get too close to her so she ends the relationship/friendship first. It was pointed out maybe the reason she has the abandonment button is because during her parent's divorce she lived with her mother and then her mother took her to live with her father. She also identified that catastrophe button - fretting over everything. However, the patient feels the reason she does this is because of all of her anxiety she experiences. The patient was active during the activity. Her sobriety date remains 2/7.    Family Program: Family present? No   Name of family member(s):   UDS collected: Yes Results: Pending  AA/NA attended?: YesMonday, Saturday and Sunday  Sponsor?: Yes   MACKENZIE,LISBETH S, Licensed Cli

## 2015-03-19 ENCOUNTER — Other Ambulatory Visit (HOSPITAL_COMMUNITY): Payer: 59 | Admitting: Psychology

## 2015-03-19 DIAGNOSIS — F1020 Alcohol dependence, uncomplicated: Secondary | ICD-10-CM

## 2015-03-19 DIAGNOSIS — F3130 Bipolar disorder, current episode depressed, mild or moderate severity, unspecified: Secondary | ICD-10-CM

## 2015-03-20 ENCOUNTER — Encounter (HOSPITAL_COMMUNITY): Payer: Self-pay | Admitting: Psychology

## 2015-03-20 NOTE — Progress Notes (Signed)
    Daily Group Progress Note  Program: CD-IOP   Group Time: 1-2:30 pm  Participation Level: Active  Behavioral Response: Sharing  Type of Therapy: Process Group  Topic: Process: the first part of group was spent in process. Members shared about any struggles or challenges they have experienced in early recovery. Two group members were present who had missed the last session and they were asked to catch the group up with their lives in early recovery. Drug tests were returned from Monday and collected from the 2 members who had missed the previous session.   Group Time: 2:45- 4pm  Participation Level: Minimal  Behavioral Response: Attentive  Type of Therapy: Psycho-education Group  Topic: "Emotional Buttons", Part II: the second half of group was spent in a psycho-ed. It was a continuation of the previous session on "Emotional Buttons". The handout was passed out to those that did not bring theirs with them today and the handout was continued where we had left off on Monday. The "Control Button" was discussed at length with members providing examples of their efforts to control others, events or situations. Also discussed were the Rescue Button, Helplessness and Injustice Buttons. There was a good discussion with excellent feedback among members and some very good points made by group members.   Summary: The patient disclosed that she had walked off her job on Tuesday morning. She was angry with herself for having just left and noted, "I wasn't raised that way". One member stated he was glad she had quit because she had shared her unhappiness with the job many times in group. 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 The patient reported she had attended 3 AA meetings since the last session and spoken with her sponsor at length. The patient complained about her terrible anxiety and admitted that  other group members were probably sick of hearing about her anxiety. Her fellow group members assured her this was not true. She complained about compulsive handwashing and how she is apologizing for everything to everyone. Another member described the same experience with his OCD and how he went about addressing always saying, "I'm sorry". He shared openly about his struggle and provided excellent feedback to his fellow group member. She shared little of herself during the psycho-ed, but was present and attentive to the discussion. The patient will have attained 60 days of sobriety as of tomorrow, but her anxiety seems to be getting most of her attention at this time. She responded well to this intervention, despite struggling with her anxiety and her sobriety date remains 2/7. The patient will pick up her 60 day chip tomorrow.   Family Program: Family present? No   Name of family member(s):   UDS collected: No Results:  AA/NA attended?: YesMonday and Tuesday  Sponsor?: Yes   Justen Fonda, LCAS

## 2015-03-21 ENCOUNTER — Other Ambulatory Visit (HOSPITAL_COMMUNITY): Payer: 59 | Admitting: Psychology

## 2015-03-21 ENCOUNTER — Encounter (HOSPITAL_COMMUNITY): Payer: Self-pay | Admitting: Medical

## 2015-03-21 DIAGNOSIS — F1020 Alcohol dependence, uncomplicated: Secondary | ICD-10-CM

## 2015-03-21 DIAGNOSIS — F3130 Bipolar disorder, current episode depressed, mild or moderate severity, unspecified: Secondary | ICD-10-CM

## 2015-03-21 DIAGNOSIS — Z811 Family history of alcohol abuse and dependence: Secondary | ICD-10-CM

## 2015-03-21 NOTE — Progress Notes (Signed)
  [facility] Discharge Summary   Jessica [name] [id number]  Date of Admission: 01/20/2015 Date of Discharge: 03/21/2015  Course of Treatment: Pt remained abstinent in program and complied with meeting and sponsorship requirements despite struggling with her anxiety.She was referred to Dr Adele Schilder and felt to be unmasking her Bipolar 1 Disorder with her abstinence from alcohol. She  is being referred to the Psych IOP for ongoing care as well.Today she saw Noemi Chapel PA at [facility] for a previously scheduled appointment as well.  Goals and Activities to Help Maintain Sobriety: 1. Stay away from old friends who continue to drink and use mind-altering chemicals. 2. Continue practicing Fair Fighting rules in interpersonal conflicts. 3. Continue alcohol and drug refusal skills and call on support systems. 4. Continue psychiatric care/[facility] Psych IOP  Referrals: Dr Adele Schilder and [facility]   Aftercare services: CD IOP Aftercare group 1. Attend AA meetings 90 meetings in 90 days 2. Continue with  sponsor and  home group in [place] 3. Return to Psychotherapist: as scheduled  Next appointment: per Dr Arfeen/[facility] and [facility]  Plan of Action to Address Continuing Problems: As above    Client has participated in the development of this discharge plan and has received a copy of this completed plan  [name]  03/21/2015   [name], PA-C 03/21/2015

## 2015-03-24 ENCOUNTER — Other Ambulatory Visit (HOSPITAL_COMMUNITY): Payer: 59

## 2015-03-25 ENCOUNTER — Other Ambulatory Visit (HOSPITAL_COMMUNITY): Payer: 59

## 2015-03-25 ENCOUNTER — Encounter (HOSPITAL_COMMUNITY): Payer: Self-pay | Admitting: Psychology

## 2015-03-25 NOTE — Progress Notes (Signed)
    Daily Group Progress Note  Program: CD-IOP   Group Time: 1-2:30 pm  Participation Level: Minimal  Behavioral Response: Appropriate  Type of Therapy: Psycho-education Group  Topic: Psycho-Ed: The first half of group was spent in a psycho-ed on the topic of "Grief and Loss". Appearing in group today was s staff member from [facility]. She invited group members to share about their own experiences with grief and loss. A number of members shared about their experiences and there was good discussion and feedback among the group. During the session, the program director pulled out the new group members and 2 who are graduating soon.  Group Time: 2:45- 4pm  Participation Level: Active  Behavioral Response: Sharing  Type of Therapy: Process Group  Topic: Process/Graduation; The second half of group was spent in process. Members shared about their struggles and challenges in early recovery. The two new group members were asked to briefly introduce themselves and shared what they hope to get from the group. As the session came to an end, a graduation ceremony was held for a member who was completing the program today. The graduation medallion was passed around the words of hope and encouragement directed towards her. The session proved very insightful with good disclosure among group members, old and new.   Summary: The patient arrived late for group today. She was attentive, but shared little during the visit with the speaker from [facility]osp[facility]. She met with the program director to complete her discharge process. She would be graduating from the program today having met program criteria and made good progress towards her goals of treatment. In process, the patient reported she had attended 3 AA Meetings. She had also reached 60 days of sobriety yesterday and the group applauded this news. She was planning on picking up her 60 day chip this evening at a meeting. The patient reported she  had been late because she had scheduled an appointment with a NP here in [place]. "I want a second opinion", she explained. I wondered if she was 'doctor shopping' and wanting someone to tell her what she wanted to hear? The patient insisted that this is not true, but that her anxiety and its constant presence are really very difficult for her. During the graduation ceremony, the patient received kind words of hope and admiration. She smiled very little and certainly didn't seem happy that she would be leaving today. She stated she would be starting the other group next week and that it would help her focus more on reducing her anxiety. The patient leaves [place] having remained sober for the entire group and having a sponsor she is working with on [place]. Her sobriety date remains 2/7.   Family Program: Family present? No   Name of family member(s):   UDS collected: No Results:   AA/NA attended?: YesMonday, Tuesday and Wednesday  Sponsor?: Yes   Terrance Lanahan, LCAS

## 2015-03-26 ENCOUNTER — Other Ambulatory Visit (HOSPITAL_COMMUNITY): Payer: 59

## 2015-03-26 ENCOUNTER — Ambulatory Visit (HOSPITAL_COMMUNITY): Payer: Self-pay | Admitting: Psychiatry

## 2015-03-27 ENCOUNTER — Other Ambulatory Visit (HOSPITAL_COMMUNITY): Payer: 59

## 2015-03-27 ENCOUNTER — Encounter: Payer: Self-pay | Admitting: Psychiatry

## 2015-03-28 ENCOUNTER — Other Ambulatory Visit (HOSPITAL_COMMUNITY): Payer: 59

## 2015-03-31 ENCOUNTER — Other Ambulatory Visit (HOSPITAL_COMMUNITY): Payer: 59

## 2015-04-01 ENCOUNTER — Other Ambulatory Visit (HOSPITAL_COMMUNITY): Payer: 59

## 2015-04-02 ENCOUNTER — Other Ambulatory Visit (HOSPITAL_COMMUNITY): Payer: 59

## 2015-04-03 ENCOUNTER — Other Ambulatory Visit (HOSPITAL_COMMUNITY): Payer: 59

## 2015-04-04 ENCOUNTER — Other Ambulatory Visit (HOSPITAL_COMMUNITY): Payer: 59

## 2015-04-07 ENCOUNTER — Other Ambulatory Visit (HOSPITAL_COMMUNITY): Payer: 59

## 2015-04-08 ENCOUNTER — Other Ambulatory Visit (HOSPITAL_COMMUNITY): Payer: 59

## 2015-04-09 ENCOUNTER — Other Ambulatory Visit (HOSPITAL_COMMUNITY): Payer: 59

## 2015-04-10 ENCOUNTER — Other Ambulatory Visit (HOSPITAL_COMMUNITY): Payer: 59

## 2015-04-11 ENCOUNTER — Other Ambulatory Visit (HOSPITAL_COMMUNITY): Payer: 59

## 2015-04-13 NOTE — Consult Note (Signed)
Psychological Assessment  Cynda Familia Evaluation: 1-7-16Administered: Rebeca Alert Gestalt Test  Trail Making Test Parts A & B for Referral: Jessica [name] was referred for a psychological assessment by her physician, Orson Slick, MD.  She was admitted to [facility] for the alcohol detoxification. The patient became quiet anxious as the time for residential substance abuse treatment neared and started drinking and voiced suicidal ideations. She also has a history of bipolar disorder, social anxiety and panic attacks.  Please see the history and physical and psychosocial history for further background information. Jessica [name] was experiencing some difficulties at her job. 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 She was terminated from her position. There is concern she may have some cognitive impairment in addition to her substance use disorder. A neuro-psychological screening was requested. Jessica [name] was pleasant and cooperative with the testing process. She attempted all tasks requested of her and appeared to try her best. She is taking medication for alcohol detoxification and motor speed appeared somewhat slowed by the medication she is taking. Otherwise, the present evaluation is considered a valid indication of current functioning. of Testing: Jessica [name] took 10 minutes to complete the [place] Gestalt. Although she placed each drawing on a separate sheet of paper she made no scorable errors. completed both parts A & B of the Trail Making Test without error. Her time to completion on both tests exceeds considerably the expected time for completion. The extended time to completion is likely an artifact of her current medications. Impression: Jessica [name] performance does not suggest brain impairment. The results are clouded by her recent relapses and current medication. Retesting after 12 months of sobriety is suggested if current  concerns continue.   Electronic Signatures: Garald Braver (PsyD, HSP-P)  (Signed on 07-Jan-16 14:54)  Authored  Last Updated: 07-Jan-16 14:54 by Garald Braver (PsyD, HSP-P)

## 2015-04-13 NOTE — Consult Note (Signed)
PATIENT NAME:  Jessica [name], Jessica [name] MR#:  [id number] DATE OF BIRTH:  [birth year]  DATE OF CONSULTATION:  12/16/2014  REFERRING PHYSICIAN:   CONSULTING PHYSICIAN:  Gonzella Lex, MD  IDENTIFYING INFORMATION AND REASON FOR CONSULTATION: A 44 year old woman reports, "I was having bad anxiety and my thoughts are racing."   HISTORY OF PRESENT ILLNESS: The patient reports that she woke up today feeling extremely anxious. She started having intrusive thoughts of killing herself by jumping out of a car. She was panicking about the idea of going to alcohol detox and rehab treatment. She reports that she has not slept in several days. She drank a pint of vodka yesterday and has been drinking for 2 or 3 days, but her mood was deteriorating badly even before that. She is feeling paranoid and hypervigilant. She is having suicidal thoughts. No homicidal ideation. Denies hallucinations. The patient says she has been compliant with her prescribed medicines of Saphris and Depakote. She has been noncompliant with her Antabuse which she was taking up until a couple of days ago when she discontinued it. She recently relocated from [place] to [place] and is living with her parents. Had a break-up with a boyfriend. Sounds like her life has been somewhat chaotic.   PAST PSYCHIATRIC HISTORY: Long history of depression and anxiety, but diagnosis was changed to bipolar disorder about 3 years ago when she had what sounds like a manic episode. She had been treated with Saphris and Depakote, although she has had Risperdal in the past as well. She thought the Depakote and Saphris had been effective. No past history of suicide attempts or violence. Does have a past history of psychotic symptoms. Additionally, she started drinking about 3 years ago. Has been able to have a few months of sobriety at a time, but has had several relapses. No inpatient rehab treatment so far just detox. Denies use of other drugs.   PAST MEDICAL  HISTORY: No significant ongoing medical problems identified.   FAMILY HISTORY: Had an uncle with depression, but nobody else in the family with mood disorders or substance abuse disorders.   SOCIAL HISTORY: Unmarried. Currently not working. Living with her parents here in town. Recent break-up with a boyfriend. Worked as an Web designer in the past.   CURRENT MEDICATIONS: Saphris 10 mg at bedtime, Depakote 500 mg at night and 250 mg in the morning.   ALLERGIES: No known drug allergies.   SUBSTANCE ABUSE HISTORY: Drinking for about 3 years, as noted above. No history of seizures or delirium tremens. No history of other drug abuse.   REVIEW OF SYSTEMS: Mood is still reported as anxious. Thoughts are racing. Feels like she is disorganized and paranoid in her thinking. Had some suicidal ideation with a plan this morning. No homicidal ideation. Physically feeling very shaky.   MENTAL STATUS EXAMINATION: Slightly disheveled woman, looks her stated age, cooperative with the interview. Eye contact intense and staring. Psychomotor activity marked by a tremor all over. Speech is broken up frequently, shows thought blocking, losing her train of thought from her anxiety. Affect anxious. Mood stated as being anxious. Denies auditory or visual hallucinations. Suicidal ideation without acute intent. No homicidal  ideation. She can remember 3/3 objects immediately, repeats 2/3 at three minutes. Alert and oriented x4. Judgment and insight reasonably intact. Normal intelligence.   DIAGNOSTIC DATA: The chemistry panel showed a glucose elevated at 311, creatinine low at 0.43, calcium low at 7.1, CO2 low at 17. The alcohol level done at 12:30  was 356. Drug screen positive for benzodiazepines. CBC unremarkable. Glucose was low then at 12:30, bounced back up yesterday to 216 and then normalized.   EKG: Normal sinus rhythm.   VITAL SIGNS: Blood pressure currently 128/61, respirations 20, pulse 105,  temperature 98.   ASSESSMENT: A 44 year old woman who has a history of bipolar disorder, currently with symptoms of mania and depression, disorganized thinking, agitation, and suicidal thoughts. Also was intoxicated having alcohol withdrawal. Unsafe to go home. Needs hospital level treatment.   TREATMENT PLAN: Suicide fall and seizure precautions in place. Admit to psychiatry. Detox protocol as well as some standing Librium. Continue Saphris as well as Depakote.   DIAGNOSIS, PRINCIPAL AND PRIMARY:  AXIS I: Bipolar disorder type I, manic.   SECONDARY DIAGNOSES: AXIS I: Alcohol abuse.  AXIS II: Deferred.  AXIS III: Rule out diabetes.  ____________________________ Gonzella Lex, MD jtc:sb D: 12/16/2014 16:49:30 ET T: 12/16/2014 17:02:36 ET JOB#: [id number]  cc: Gonzella Lex, MD, <Dictator> Gonzella Lex MD ELECTRONICALLY SIGNED 01/21/2015 18:58

## 2015-04-13 NOTE — Consult Note (Signed)
PATIENT NAME:  Jessica [name], HEMMER MR#:  [id number] DATE OF BIRTH:  07/23/1971  DATE OF CONSULTATION:  01/24/2015  IDENTIFYING AND REASON FOR CONSULT: This is a 45 year old woman with a history of mood disorder and anxiety disorder, as well as alcohol abuse, who came into the hospital reporting fears that she was going to become violent.   HISTORY OF PRESENT ILLNESS: Information obtained from the patient and from the chart, as well as from conversation with her father and stepmother who are also in the room. The patient was in [facility] the week before last temporarily and at that time, they change some of her medicines around. After she got out of the hospital, she went on a 5-day drinking binge, but now has been sober since past Saturday. Over the last several days, she has continued to have severe anxiety symptoms. She reports having a feeling like she is panicking all the time for several days while she has been withdrawing from alcohol. She also has started to have obsessive thoughts. She has obsessive ruminations about whether she might do anything violent to hurt her stepmother. She is extremely clear that she has no desire or wish to do anything to harm her stepmother. She also has no desire or wish to harm herself. She has never been violent in the past. There is really every reason to see these as being obsessions rather than intentional thoughts. Mood has been anxious. Sleep has been poor for several days. Today, she went to her IOP program and says that she transiently saw someone's face turn into his skull. This got her very upset. She also is complaining of having, what she calls "out of body experiences." She has been compliant with her medicine and went back to see Dr. Buena Irish this past week. Her current medication includes gabapentin, BuSpar, Lamictal, Depakote, Saphris,  Vistaril, Cogentin and Remeron.   PAST PSYCHIATRIC HISTORY: We have seen her a couple of times before in the  Emergency Room. History of chronic mental health problems and substance abuse. Heavy drinking in the past. It sounds like she has now been sober for at least 6 days. Past diagnosis has been confusing, at times depression, at times anxiety disorder, at times bipolar disorder. She says that Dr. Buena Irish told her that he is leaning towards an obsessive-compulsive diagnosis. She does not have any past history of violence.   FAMILY HISTORY: She reports she has 2 brothers who also have mental health problems. No family history of suicide.   SOCIAL HISTORY: Not married, no children. She just recently moved from another state back here to [place] to be with her father. Living with her father and stepmother. Not currently working.   PAST MEDICAL HISTORY: No significant medical problems other than her mental health issues.   SUBSTANCE ABUSE HISTORY: Long history of alcohol abuse. Has been able to maintain some sobriety in the past and is now struggling to continue her current sobriety. She is going to [place] and has a sponsor as well.   REVIEW OF SYMPTOMS: Physically, she does not have any specific complaints. No pain. No fever, no chills. No nausea. No breathing problems. Mentally, she complains of feeling like she is panicking. She denies any suicidal or homicidal ideation or wish or plan. She is not currently complaining of any hallucinations.   MENTAL STATUS EXAMINATION: This is a neatly groomed woman, interviewed with her father and stepmother present. Eye contact is normal, at times rather intense. Psychomotor activity is fidgety  and she crochets throughout the interview. Speech is at times, rapid and clipped, but not pressured. Affect is quite anxious-looking. Mood is stated as being worried. Thoughts are lucid without any sign of psychosis. No delusions. She is obviously excessively worrying about every little thing. No hallucinations currently. Denies suicidal or homicidal ideation. The patient is alert  and oriented x4. Short and long-term memory are intact. Judgment and insight intermittent, sometimes subject to her having to have a lot of reassurance.   CURRENT MEDICATIONS: Depakote 500 mg twice a day, gabapentin 400 mg 4 times a day, lamotrigine 25 mg on alternating days, mirtazapine 30 mg at night, Cogentin 1 mg twice a day as needed for tremors, Saphris 10 mg sublingual at night, BuSpar 5 mg 3 times a day, Vistaril 25 mg 3 times a day as needed for anxiety.   ALLERGIES: NO KNOWN DRUG ALLERGIES.   LABORATORY RESULTS: Chemistry panel unremarkable. Alcohol level negative. Drug screen is positive once again for barbiturates and I am not sure what that is coming from. I think we have seen that before. The CBC is entirely normal. Pregnancy test is negative. Urinalysis unremarkable.   VITAL SIGNS: Blood pressure is now 105/74, respirations 19, pulse 88, temperature 98.   ASSESSMENT: This is a 44 year old woman with alcohol abuse, now into her first week sobriety. Probably still having some alcohol withdrawal symptoms. Additionally, she has chronic mental health issues which, although they have been diagnosed as bipolar, seem to me to be dominated by anxiety symptoms with obsessive compulsive symptoms now. There is no sign that she is actually psychotic or has any actual suicidal or homicidal intent or plan. The patient does not require hospitalization.   TREATMENT PLAN: The case discussed with Emergency Room doctor. Recommend that she be released from the Emergency Room. I did discuss the fact that she has not slept for days and suggested that we could add 100 mg of Seroquel at night if she stopped taking the Saphris. She agrees to discontinue the Saphris and replace it with Seroquel 100 mg at night for now, as that will help her sleep better. Continue other medicine. Follow up with her outpatient psychiatrist and continue to go to [place] meetings. Supportive and educational counseling completed.    DIAGNOSIS, PRINCIPLE AND PRIMARY:  AXIS I: Anxiety disorder, not otherwise specified.   SECONDARY DIAGNOSES:  AXIS I:  1. History of bipolar disorder.  2. Rule out obsessive-compulsive disorder.  3. Alcohol abuse, severe anxiety in part due to alcohol withdrawal.  ____________________________ Gonzella Lex, MD jtc:ap D: 01/24/2015 20:06:04 ET T: 01/24/2015 20:49:47 ET JOB#: [id number]  cc: Gonzella Lex, MD, <Dictator> Gonzella Lex MD ELECTRONICALLY SIGNED 01/27/2015 10:42

## 2015-04-13 NOTE — Consult Note (Signed)
PATIENT NAME:  Jessica [name], Jessica [name] MR#:  [id number] DATE OF BIRTH:  1971-09-26  DATE OF CONSULTATION:  01/06/2015  REFERRING PHYSICIAN:   CONSULTING PHYSICIAN:  Gonzella Lex, MD  IDENTIFYING INFORMATION AND REASON FOR CONSULTATION: A 44 year old woman with a history of alcohol abuse and chronic mood and anxiety problems, who was brought to the hospital by EMS.   CHIEF COMPLAINT: "I had a bad panic attack."   HISTORY OF PRESENT ILLNESS: Information obtained from the patient and the chart. The patient states that this morning she was having, what she calls, a very bad panic attack. She describes it as having lot of physical symptoms, twitching all over her body, shortness of breath, increased heart rate. Initially, she was not able to identify exactly why she had it, but proceeded to admit that she had relapsed and had been drinking relatively heavily this week. Michela Pitcher that she had been drinking about 6 glasses of wine yesterday and probably just as much as on Saturday. Prior to that, she said it had been about 3 weeks since she had had a drink or probably, that is, since she was last here in the hospital. Overall, she said she has been feeling a little more nervous the last few days. Sleep is erratic. Appetite is a little bit down. She denies having any suicidal thoughts. She admits that she made some statements about wanting to kill herself when she came into the Emergency Room, but she was intoxicated at that time. Did not actually do anything to try to kill herself; totally denies having any suicidal wishes. Denies that she is having any psychotic symptoms. She says she is compliant with her prescribed medication, which she gets from the psychiatrist she is seeing right now, but she is not seeing a therapist or getting specifics substance abuse treatment.   PAST PSYCHIATRIC HISTORY: Moved here from [place] to live with her parents a few months ago. Not working. Has a history of mental health problems  going back years, including both bipolar disorder and alcohol abuse. Two prior hospitalizations, 1 in [place] and 1 just earlier this month at our facility. Says she has never actually really tried to kill herself in the past. She has been diagnosed with bipolar disorder and is currently taking a combination of Saphris, Depakote, and BuSpar for her illness. Seeing Dr. Buena Irish in [place].   FAMILY HISTORY: Two brothers with severe mental health problems.   No history of family suicide.   SOCIAL HISTORY: Not married. No children. Living with her parents, specifically her father and stepmother. Says she is trying to look for work, but feels anxious about it.   PAST MEDICAL HISTORY: No significant ongoing medical problems.   SUBSTANCE ABUSE HISTORY: Long history of alcohol abuse. Has been able to maintain sobriety for up to 15 months at a time in the past, but recently has been struggling more. She denies that she is abusing other drugs.   REVIEW OF SYSTEMS: Currently her mood is feeling stable. Denies suicidal ideation. Denies homicidal ideation. Denies any hallucinations. Denies any specific physical symptoms, but does say she is feeling a little bit shaky.   MENTAL STATUS EXAMINATION: Neatly groomed woman, looks her stated age, cooperative the interview. Eye contact good. Psychomotor activity normal. No tremor noticed. Speech is normal in amount, but kind of flat in tone, which I have noticed is pretty typical for her. Her affect is sort of flat, almost autistic looking. Mood is stated as being okay. Thoughts are  lucid without loosening of associations or delusions. No evidence of thought disorder. Denies hallucinations. Denies suicidal or homicidal ideation. She is alert and oriented x 4. Can repeat 3 objects immediately, remembers 3 out of 3 at 3 minutes. Her judgment and insight has improved. Intelligence normal.   LABORATORY RESULTS: Drug screen was positive for barbiturates. The chemistry  panel showed a low sodium at 132, elevated ALT 78, elevated AST 68. Alcohol level on admission was 35. CBC unremarkable. Urinalysis with 1+ blood, no infection.   VITAL SIGNS: Blood pressure is currently 118/82, respirations 18, pulse 108, temperature 98.7.   ASSESSMENT: A 44 year old woman with a history of bipolar disorder and alcohol abuse, came into the Emergency Room intoxicated and made a suicidal statement, but not did not actually act on it. Currently sober and totally denies suicidal ideation. Has a positive plan for the future. Has been compliant with outpatient treatment. Has insight into the severity of her substance abuse problem, and agrees that she needs to be getting more serious about her substance abuse treatment. At this point, she not psychotic or acutely dangerous, and does not meet commitment criteria.   TREATMENT PLAN: The case was discussed the Emergency Room doctor. The patient can be released from involuntary commitment and discharged home. No change to medicine. She was given some Ativan to calm her down here in the Emergency Room, but does not need a prescription at discharge. Follow up with her outpatient psychiatrist.   DIAGNOSIS, PRINCIPAL AND PRIMARY:  AXIS I: Alcohol abuse, severe.   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 Bipolar disorder.  AXIS II: Deferred.  AXIS III: No diagnosis.    ____________________________ Gonzella Lex, MD jtc:MT D: 01/06/2015 12:34:53 ET T: 01/06/2015 12:49:00 ET JOB#: [id number]  cc: Gonzella Lex, MD, <Dictator> Gonzella Lex MD ELECTRONICALLY SIGNED 01/22/2015 17:20

## 2015-04-13 NOTE — H&P (Signed)
PATIENT NAME:  Jessica [name], Jessica [name] MR#:  [id number] DATE OF BIRTH:  October 05, 1971  REFERRING PHYSICIAN: Emergency Room MD   ATTENDING PHYSICIAN: Jessica Gibeau B. Bary Leriche, MD   IDENTIFYING DATA: Jessica [name] is a 44 year old female with history of bipolar disorder and alcoholism.   CHIEF COMPLAINT: "I had a nervous breakdown."  HISTORY OF PRESENT ILLNESS: Jessica [name] has a history of bipolar illness. She has been stable on a combination of Saphris and Depakote. She also has history of alcohol dependence. She had been drinking heavily for several years now. Three months ago she relocated from [place] to [place]. She was hoping that she would be able to stop. She went to [facility] for detoxification and alcohol treatment, but she keeps relapsing. She was placed on Antabuse, but stopped taking it recently and started drinking heavily. She decided to seek  alcohol treatment and chose [facility]. The bed will be available there at the end of this week. She became increasingly anxious about going to treatment. She started feeling suicidal. She had a plan to jump out of the driving car.  She got drunk. Her blood alcohol level on admission was over 300, felt unsafe, and came to the hospital for help. She denies any psychotic symptoms. There are no symptoms of bipolar mania. She denies other than alcohol, substance abuse. She complains of symptoms of depression with poor sleep, decreased appetite, anhedonia, feeling of guilt, hopelessness, worthlessness, poor energy and concentration, crying spells, social isolation, and now suicidal thoughts. She also complains of heightened anxiety. She has been anxious all her life. In the past she used to abuse clonazepam, as well as alcohol.  She is complaining of severe anxiety with panic attacks, social anxiety, generalized anxiety disorder.   PAST PSYCHIATRIC HISTORY: As above.  She had several hospital admissions briefly for alcohol detoxification.  She had one manic episode several years ago at which time her diagnosis from depression was changed to bipolar disorder and she was started on mood stabilizers. She went for treatment at [facility] in [place] recently. She has never been in  rehabilitation. She never attempted suicide.   FAMILY PSYCHIATRIC HISTORY:  One uncle with bipolar disorder, no substance abuse history.  PAST MEDICAL HISTORY: None.   ALLERGIES: No known drug allergies.   MEDICATIONS ON ADMISSION: Saphris 10 mg at bedtime, Depakote 250 in the morning, 500 at bedtime.   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 She is unemployed at the moment and trying to take care of her drinking problem. She has insurance. She lives with her mother and father in the area.   REVIEW OF SYSTEMS:   CONSTITUTIONAL: No fevers or chills. No weight changes.  EYES: No double or blurred vision.  EARS, NOSE, THROAT: No hearing loss.  RESPIRATORY: No shortness of breath or cough.  CARDIOVASCULAR: No chest pain or orthopnea.  GASTROINTESTINAL: No abdominal pain, nausea, vomiting, or diarrhea.  GENITOURINARY: No incontinence or frequency.  ENDOCRINE: No heat or cold intolerance.  LYMPHATIC: No anemia or easy bruising.  INTEGUMENTARY: No acne or rash.  MUSCULOSKELETAL: No muscle or joint pain.  NEUROLOGIC: No tingling or weakness.  PSYCHIATRIC: See history of present illness for details.   PHYSICAL EXAMINATION: VITAL SIGNS: Blood pressure 107/77, pulse 77, respirations 20, temperature 97.9.  GENERAL: This is a slender, middle-aged female in no acute distress.  HEENT: The pupils are equal, round, and reactive to light. Sclerae anicteric.  NECK: Supple. No  thyromegaly.  LUNGS: Clear to auscultation. No dullness to percussion.  HEART: Regular rhythm and rate. No murmurs, rubs, or gallops.  ABDOMEN: Soft, nontender, nondistended. Positive bowel sounds.  MUSCULOSKELETAL: Normal  muscle strength in all extremities.  SKIN: No rashes or bruises.  LYMPHATIC: No cervical adenopathy.  NEUROLOGIC: Cranial nerves II through XII are intact.   LABORATORY DATA: Chemistries are within normal limits except for blood glucose of 311 that dropped to 49, but then stabilized around 100. Blood alcohol level on admission 356. LFTs within normal limits. Urine toxicology screen positive for benzodiazepines. CBC within normal limits. Urinalysis is not suggestive of urinary tract infection. Urine pregnancy test is negative.   EKG: Normal sinus rhythm, possible left atrial enlargement. Abnormal EKG.  MENTAL STATUS EXAMINATION ON ADMISSION: The patient is alert and oriented to person, place, time, and situation. She is pleasant, polite, and cooperative. She is well groomed and casually dressed. She maintains good eye contact. Her speech is of normal rhythm, rate, and volume. Mood is depressed with anxious affect. Thought process is logical and goal oriented. Thought content: She is able to contract for safety on the unit, but was admitted for suicidal ideation with a plan to jump out of the moving car. There are no thoughts of hurting others. There are no delusions or paranoia. There are no auditory or visual hallucinations. Her cognition is grossly intact. Registration, recall, short- and long-term memory are intact. She is of average intelligence and fund of knowledge. Her insight and judgment are questionable.   SUICIDE RISK ASSESSMENT ON ADMISSION: This is a patient with a history of bipolar disorder and alcoholism who came to the hospital anxious, depressed, and suicidal in the context of relapse on alcohol.   INITIAL DIAGNOSES: Axis I:  Bipolar I disorder depressed.   Alcohol use disorder severe. AXIS II: Deferred. AXIS III: Deferred.  PLAN: The patient was admitted to [facility] for safety, stabilization and medication managment.  1.  Suicidal ideation. The patient is able to contract for  safety.  2.  Mood. We will continue Saphris and Depakote. Depakote level was not checked on admission. The patient claims to be compliant. We will increase Depakote to 500 mg twice daily and recheck the level. 3.  Anxiety. She complains of severe anxiety. She is on the CIWA protocol. Will add Vistaril 25 mg 4 times daily. 4.  Alcohol detoxification. She is on the CIWA protocol and Librium taper. She received 8 mg of Ativan in the past 24 hours. She feels shaky and very anxious.  5.  Insomnia:  She responded well to Ambien in the past. We will give 5 mg nightly.  7.  Polysubstance abuse treatment. The patient reportedly made arrangements to go to rehabilitation at [facility]. Social worker will talk to the family and will try to transfer her to [facility] when detoxification completed.  8.  Disposition to be established.    ____________________________ Wardell Honour. Bary Leriche, MD jbp:LT D: 12/17/2014 15:50:34 ET T: 12/17/2014 16:41:06 ET JOB#: [id number]  cc: Rollo Farquhar B. Bary Leriche, MD, <Dictator> Clovis Fredrickson MD ELECTRONICALLY SIGNED 12/19/2014 15:50

## 2015-04-14 ENCOUNTER — Other Ambulatory Visit (HOSPITAL_COMMUNITY): Payer: 59

## 2015-04-14 ENCOUNTER — Ambulatory Visit (HOSPITAL_COMMUNITY): Payer: Self-pay | Admitting: Psychiatry

## 2015-04-15 ENCOUNTER — Other Ambulatory Visit (HOSPITAL_COMMUNITY): Payer: 59

## 2015-04-16 ENCOUNTER — Other Ambulatory Visit (HOSPITAL_COMMUNITY): Payer: 59

## 2015-04-17 ENCOUNTER — Other Ambulatory Visit (HOSPITAL_COMMUNITY): Payer: 59

## 2015-04-18 ENCOUNTER — Other Ambulatory Visit (HOSPITAL_COMMUNITY): Payer: 59

## 2015-04-21 ENCOUNTER — Other Ambulatory Visit (HOSPITAL_COMMUNITY): Payer: 59

## 2015-04-22 ENCOUNTER — Other Ambulatory Visit (HOSPITAL_COMMUNITY): Payer: 59

## 2015-04-23 ENCOUNTER — Other Ambulatory Visit (HOSPITAL_COMMUNITY): Payer: 59

## 2015-04-24 ENCOUNTER — Other Ambulatory Visit (HOSPITAL_COMMUNITY): Payer: 59

## 2015-04-25 ENCOUNTER — Other Ambulatory Visit (HOSPITAL_COMMUNITY): Payer: 59

## 2015-04-25 ENCOUNTER — Encounter: Payer: Self-pay | Admitting: Psychiatry

## 2015-04-28 ENCOUNTER — Other Ambulatory Visit (HOSPITAL_COMMUNITY): Payer: 59

## 2015-04-29 ENCOUNTER — Other Ambulatory Visit (HOSPITAL_COMMUNITY): Payer: 59

## 2015-04-30 ENCOUNTER — Other Ambulatory Visit (HOSPITAL_COMMUNITY): Payer: 59

## 2015-05-01 ENCOUNTER — Other Ambulatory Visit (HOSPITAL_COMMUNITY): Payer: 59

## 2015-05-02 ENCOUNTER — Other Ambulatory Visit (HOSPITAL_COMMUNITY): Payer: 59

## 2015-05-05 ENCOUNTER — Other Ambulatory Visit (HOSPITAL_COMMUNITY): Payer: 59

## 2016-02-23 ENCOUNTER — Other Ambulatory Visit: Payer: Self-pay | Admitting: Internal Medicine

## 2016-02-23 DIAGNOSIS — M7989 Other specified soft tissue disorders: Secondary | ICD-10-CM

## 2016-03-05 ENCOUNTER — Ambulatory Visit: Payer: BLUE CROSS/BLUE SHIELD

## 2016-05-25 ENCOUNTER — Other Ambulatory Visit: Payer: Self-pay | Admitting: Obstetrics and Gynecology

## 2016-05-25 DIAGNOSIS — Z1231 Encounter for screening mammogram for malignant neoplasm of breast: Secondary | ICD-10-CM

## 2016-06-11 ENCOUNTER — Ambulatory Visit
Admission: RE | Admit: 2016-06-11 | Discharge: 2016-06-11 | Disposition: A | Discharge status: Home / Self Care | Payer: BLUE CROSS/BLUE SHIELD | Source: Ambulatory Visit | Attending: Obstetrics and Gynecology | Admitting: Obstetrics and Gynecology

## 2016-06-11 ENCOUNTER — Other Ambulatory Visit: Payer: Self-pay | Admitting: Obstetrics and Gynecology

## 2016-06-11 DIAGNOSIS — Z1231 Encounter for screening mammogram for malignant neoplasm of breast: Secondary | ICD-10-CM | POA: Diagnosis not present

## 2016-06-16 ENCOUNTER — Other Ambulatory Visit: Payer: Self-pay | Admitting: Obstetrics and Gynecology

## 2016-06-16 DIAGNOSIS — N63 Unspecified lump in unspecified breast: Secondary | ICD-10-CM

## 2016-09-07 ENCOUNTER — Ambulatory Visit
Admission: RE | Admit: 2016-09-07 | Discharge: 2016-09-07 | Disposition: A | Discharge status: Home / Self Care | Payer: BLUE CROSS/BLUE SHIELD | Source: Ambulatory Visit | Attending: Obstetrics and Gynecology | Admitting: Obstetrics and Gynecology

## 2016-09-07 DIAGNOSIS — N6489 Other specified disorders of breast: Secondary | ICD-10-CM | POA: Insufficient documentation

## 2016-09-07 DIAGNOSIS — N63 Unspecified lump in unspecified breast: Secondary | ICD-10-CM

## 2017-04-30 IMAGING — MG MM DIGITAL DIAGNOSTIC UNILAT*R* W/ TOMO W/ CAD
6 of 9 series · 6 of 21 positions shown · non-contrast
Comparison: 06/11/2016.

CLINICAL DATA: Recall from baseline screening mammography.

EXAM:
2D DIGITAL DIAGNOSTIC UNILATERAL RIGHT MAMMOGRAM WITH CAD AND
ADJUNCT TOMO

[R MLO (1 of 2)]
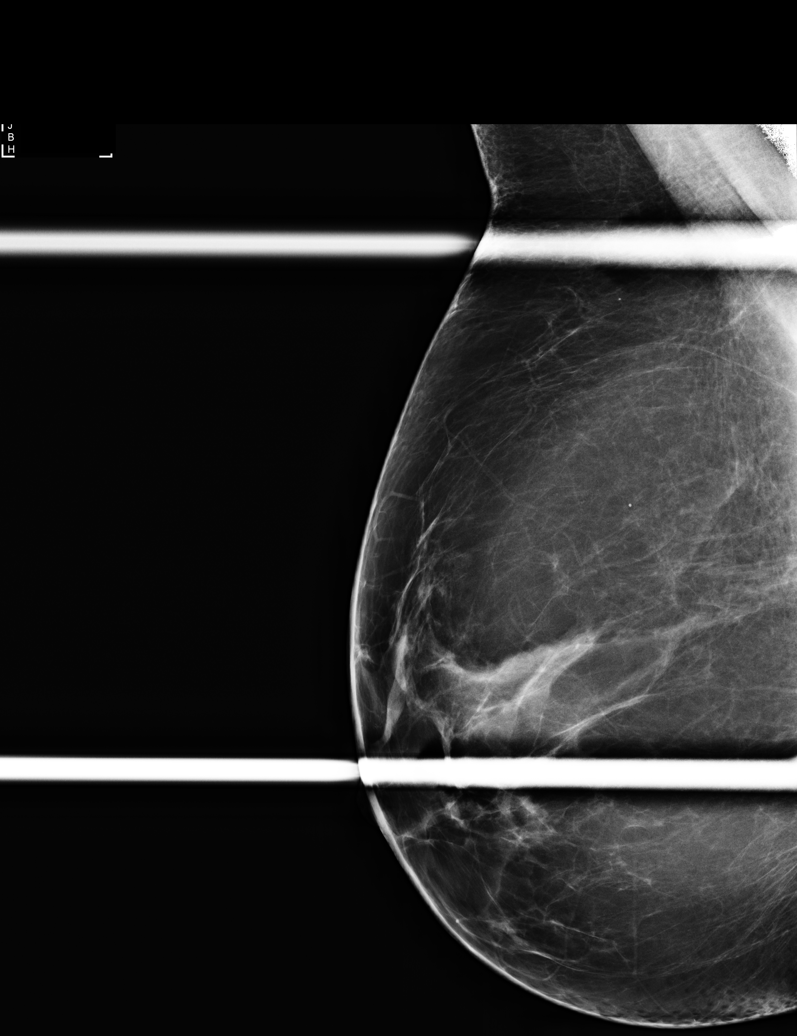

[R MLO synth-2D (1 of 2)]
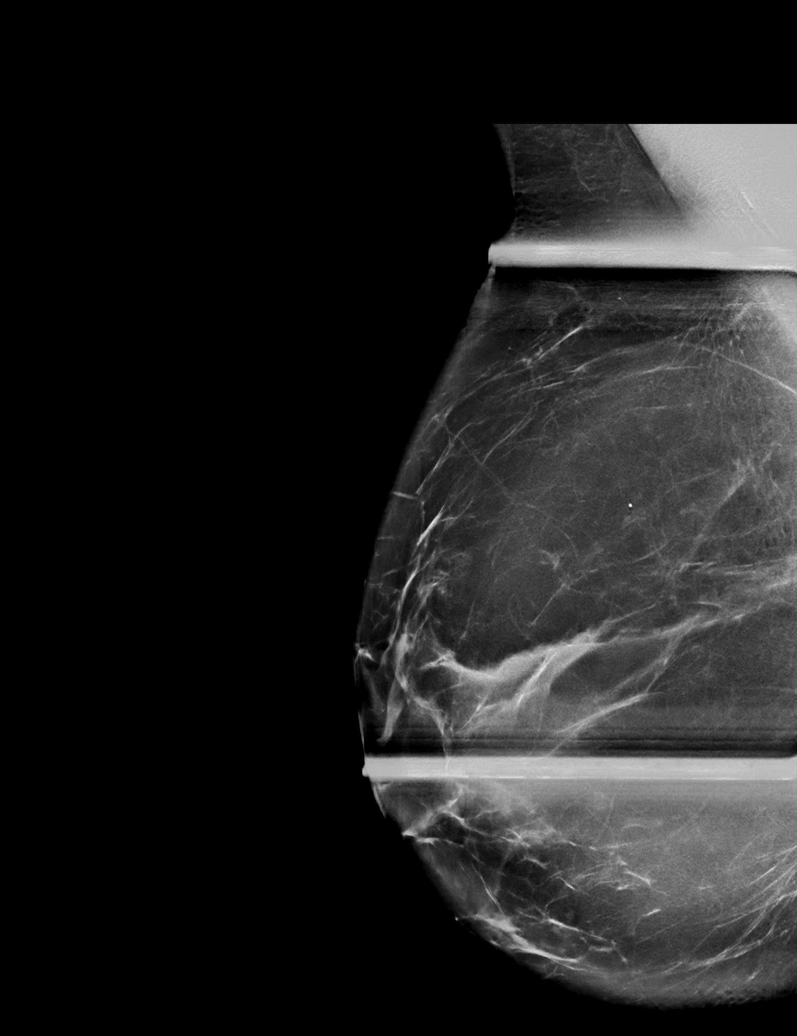

[R MLO synth-2D (2 of 2)]
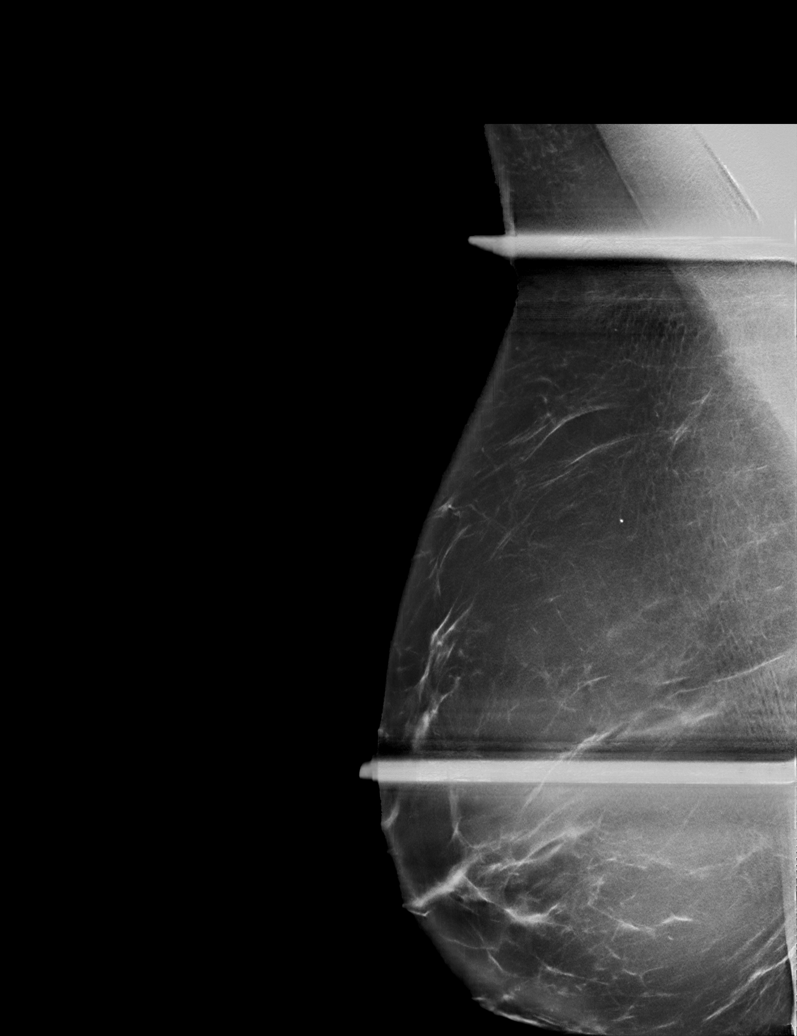

[R MLO (2 of 2)]
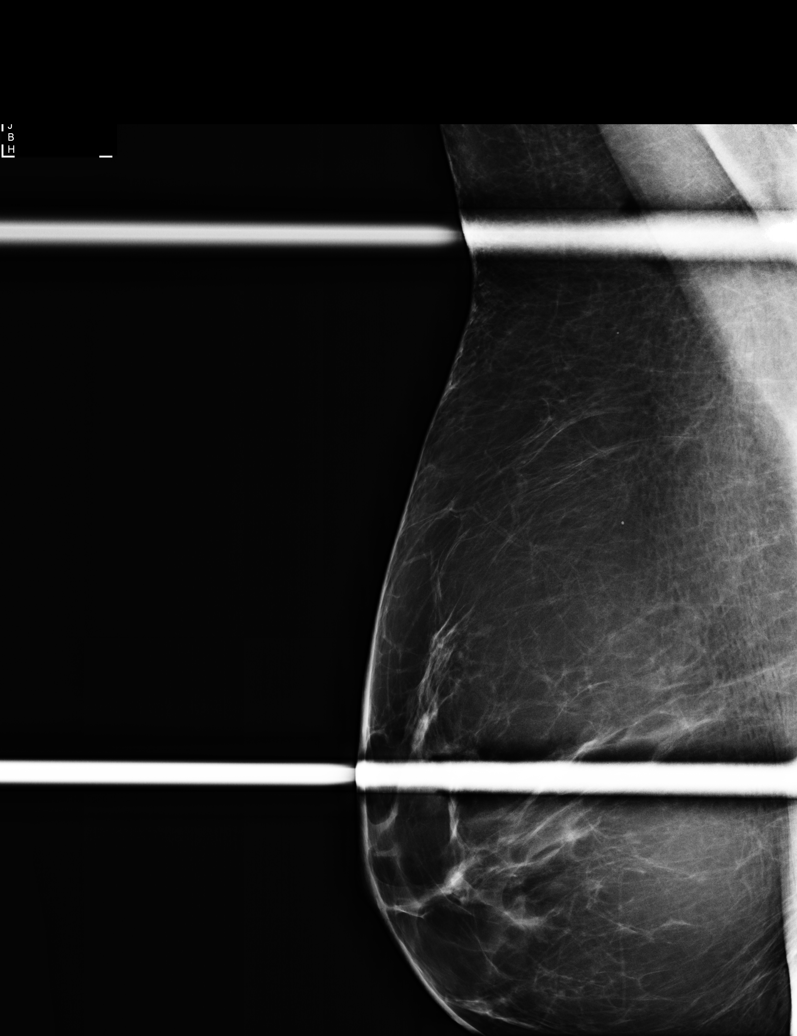

[R ML synth-2D]
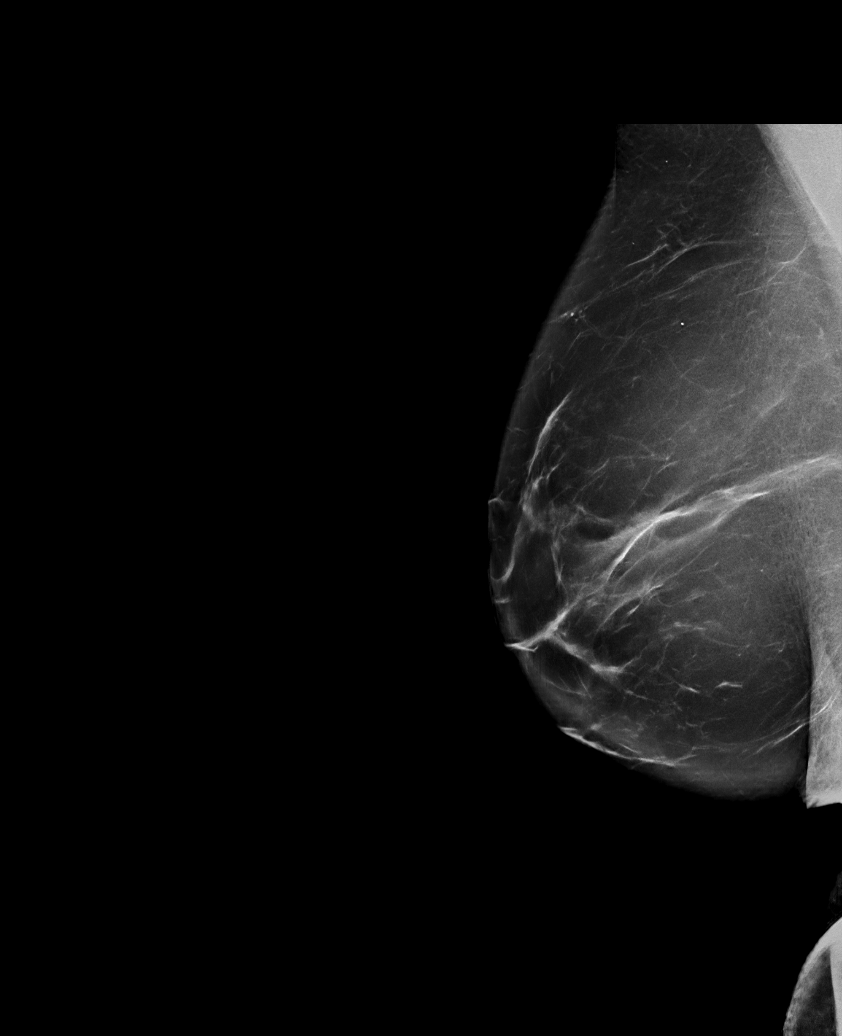

[R ML]
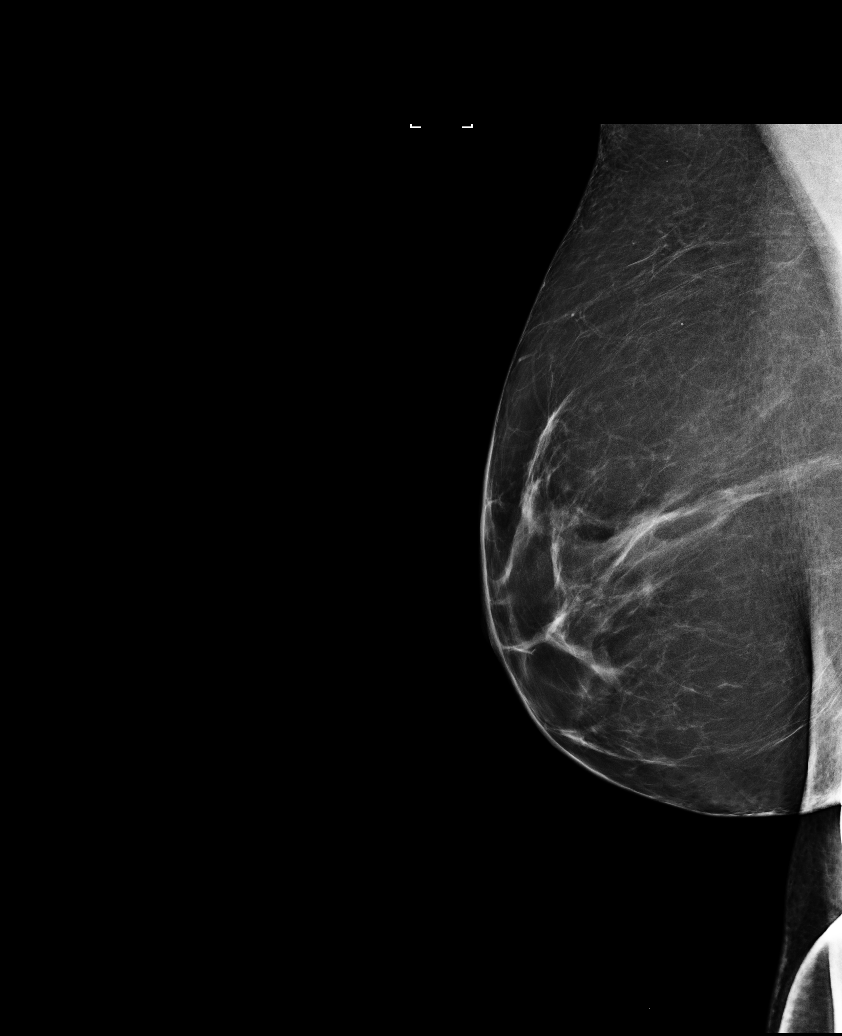

[6 of 21 positions shown; findings below may reference images not displayed]

There are no older prior studies available
for comparison.

ACR Breast Density Category b: There are scattered areas of
fibroglandular density.
FINDINGS: Additional views of the right breast demonstrate slight asymmetry
located laterally within the right breast to be most consistent with
asymmetrical fibroglandular tissue. There is no mass or distortion.

Mammographic images were processed with CAD.
IMPRESSION: Mild asymmetry located laterally within the right breast consistent
with mildly asymmetrical fibroglandular tissue. Recommend bilateral
screening mammography in May 2017.

RECOMMENDATION:
Bilateral screening mammography in May 2017.

I have discussed the findings and recommendations with the patient.
Results were also provided in writing at the conclusion of the
visit. If applicable, a reminder letter will be sent to the patient
regarding the next appointment.

BI-RADS CATEGORY  1: Negative.

## 2018-01-31 ENCOUNTER — Emergency Department (HOSPITAL_COMMUNITY): Payer: Worker's Compensation

## 2018-01-31 ENCOUNTER — Other Ambulatory Visit: Payer: Self-pay

## 2018-01-31 ENCOUNTER — Encounter (HOSPITAL_COMMUNITY): Payer: Self-pay | Admitting: *Deleted

## 2018-01-31 DIAGNOSIS — Y93G1 Activity, food preparation and clean up: Secondary | ICD-10-CM | POA: Diagnosis not present

## 2018-01-31 DIAGNOSIS — Z23 Encounter for immunization: Secondary | ICD-10-CM | POA: Insufficient documentation

## 2018-01-31 DIAGNOSIS — Y990 Civilian activity done for income or pay: Secondary | ICD-10-CM | POA: Diagnosis not present

## 2018-01-31 DIAGNOSIS — W25XXXA Contact with sharp glass, initial encounter: Secondary | ICD-10-CM | POA: Insufficient documentation

## 2018-01-31 DIAGNOSIS — Y929 Unspecified place or not applicable: Secondary | ICD-10-CM | POA: Diagnosis not present

## 2018-01-31 DIAGNOSIS — F17210 Nicotine dependence, cigarettes, uncomplicated: Secondary | ICD-10-CM | POA: Diagnosis not present

## 2018-01-31 DIAGNOSIS — S61412A Laceration without foreign body of left hand, initial encounter: Secondary | ICD-10-CM | POA: Insufficient documentation

## 2018-01-31 DIAGNOSIS — Z79899 Other long term (current) drug therapy: Secondary | ICD-10-CM | POA: Insufficient documentation

## 2018-01-31 NOTE — ED Triage Notes (Signed)
Pt cut lateral left hand on wine glass at work, bleeding controlled and edges intact.

## 2018-02-01 ENCOUNTER — Emergency Department (HOSPITAL_COMMUNITY)
Admission: EM | Admit: 2018-02-01 | Discharge: 2018-02-01 | Disposition: A | Discharge status: Home / Self Care | Payer: Worker's Compensation | Attending: Emergency Medicine | Admitting: Emergency Medicine

## 2018-02-01 DIAGNOSIS — S61412A Laceration without foreign body of left hand, initial encounter: Secondary | ICD-10-CM

## 2018-02-01 MED ORDER — TETANUS-DIPHTH-ACELL PERTUSSIS 5-2.5-18.5 LF-MCG/0.5 IM SUSP
0.5000 mL | Freq: Once | INTRAMUSCULAR | Status: AC
  Administered 2018-02-01: 0.5 mL via INTRAMUSCULAR
  Filled 2018-02-01: qty 0.5

## 2018-02-01 MED ORDER — LIDOCAINE HCL (PF) 1 % IJ SOLN
5.0000 mL | Freq: Once | INTRAMUSCULAR | Status: AC
  Administered 2018-02-01: 5 mL via INTRADERMAL
  Filled 2018-02-01: qty 30

## 2018-02-01 NOTE — ED Provider Notes (Signed)
[facility] DEPT Provider Note   CSN: [id number] Arrival date & time: 01/31/18  2147     History   Chief Complaint Chief Complaint  Patient presents with  . Laceration    Left Hand    HPI Jessica [name] is a 47 y.o. female.  The history is provided by the patient and medical records.  Laceration       47 year old female with history of anxiety and bipolar 1 disorder, presenting to the ED with left hand laceration.  Reports she was polishing a wine glass at work when it broke and cut the lateral aspect of her left hand.  Bleeding controlled on arrival.  Last tetanus was 12 years ago.  She denies any numbness or weakness of her hand.  She is right-hand dominant.  Past Medical History:  Diagnosis Date  . Anxiety   . Bipolar 1 disorder [facility])     Patient Active Problem List   Diagnosis Date Noted  . Bipolar 1 disorder ([place]) 02/07/2015  . 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 Chronic post-traumatic stress disorder (PTSD) 02/07/2015  . Family history of alcoholism 02/07/2015  . Acute bronchitis 02/07/2015  . 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 Bipolar I disorder, most recent episode depressed ([place]) 01/08/2015  . Alcohol use disorder, severe, dependence ([place]) 01/07/2015  . Alcohol addiction ([place]) 01/07/2015    History reviewed. No pertinent surgical history.  OB History    No data available       Home Medications    Prior to Admission medications   Medication Sig Start Date End Date Taking? Authorizing Provider  acamprosate (CAMPRAL) 333 MG tablet Take 2 tablets (666 mg total) by mouth 3 (three) times daily with meals. 03/07/15   [name], PA-C  asenapine (SAPHRIS) 5 MG SUBL 24 hr tablet Place 1 tablet (5 mg total) under the tongue at bedtime. 03/11/15   Arfeen, Arlyce Harman, MD  divalproex (DEPAKOTE) 500 MG DR tablet Take 1 tab in am and 2 at bed time 03/11/15   Arfeen, Arlyce Harman, MD  mirtazapine (REMERON) 15 MG tablet Take 1  tablet (15 mg total) by mouth at bedtime. For depression/sleep Patient taking differently: Take 30 mg by mouth at bedtime. For depression/sleep 02/25/15   Arfeen, Arlyce Harman, MD  sertraline (ZOLOFT) 25 MG tablet Take 1 tablet (25 mg total) by mouth daily. 03/18/15   Arfeen, Arlyce Harman, MD    Family History Family History  Problem Relation Age of Onset  . Alcohol abuse Paternal Uncle   . Alcohol abuse Paternal Grandfather   . Alcohol abuse Paternal Grandmother   . Depression Paternal Uncle   . Breast cancer Neg Hx     Social History Social History   Tobacco Use  . Smoking status: Current Every Day Smoker    Types: Cigarettes  . Smokeless tobacco: Never Used  Substance Use Topics  . Alcohol use: Yes    Alcohol/week: 8.4 oz    Types: 4 Glasses of wine, 10 Shots of liquor per week  . Drug use: Yes    Types: Marijuana, Benzodiazepines     Allergies   Patient has no known allergies.   Review of Systems Review of Systems  Skin: Positive for wound.  All other systems reviewed and are negative.    Physical Exam Updated Vital Signs BP (!) 117/50 (BP Location: Right Arm)   Pulse 84   Temp 98.6 F (37 C) (Oral)   Resp 16   Ht 5\' 8"  (  1.727 m)   Wt 99.8 kg (220 lb)   LMP 01/10/2018   SpO2 98%   BMI 33.45 kg/m   Physical Exam  Constitutional: She is oriented to person, place, and time. She appears well-developed and well-nourished.  HENT:  Head: Normocephalic and atraumatic.  Mouth/Throat: Oropharynx is clear and moist.  Eyes: Conjunctivae and EOM are normal. Pupils are equal, round, and reactive to light.  Neck: Normal range of motion.  Cardiovascular: Normal rate, regular rhythm and normal heart sounds.  Pulmonary/Chest: Effort normal and breath sounds normal. No stridor. No respiratory distress.  Abdominal: Soft. Bowel sounds are normal. There is no tenderness. There is no rebound.  Musculoskeletal: Normal range of motion.  4cm superficial laceration to lateral left hand,  no active bleeding, no deep tissue, vessel, or tendon involvement; no retained FB; full ROM of all fingers; normal distal sensation and perfusion  Neurological: She is alert and oriented to person, place, and time.  Skin: Skin is warm and dry.  Psychiatric: She has a normal mood and affect.  Nursing note and vitals reviewed.    ED Treatments / Results  Labs (all labs ordered are listed, but only abnormal results are displayed) Labs Reviewed - No data to display  EKG  EKG Interpretation None       Radiology Dg Hand Complete Left  Result Date: 01/31/2018 CLINICAL DATA:  Laceration from broken glass. EXAM: LEFT HAND - COMPLETE 3+ VIEW COMPARISON:  None. FINDINGS: There is no evidence of fracture or dislocation. There is no evidence of arthropathy or other focal bone abnormality. Soft tissues are unremarkable. No radiopaque foreign body. A dressing overlies the ulnar aspect of the hand. No tracking soft tissue air. IMPRESSION: No radiopaque foreign body or acute osseous abnormality. Electronically Signed   By: Jeb Levering M.D.   On: 01/31/2018 23:06    Procedures Procedures (including critical care time)  LACERATION REPAIR Performed by: Larene Pickett Authorized by: Larene Pickett Consent: Verbal consent obtained. Risks and benefits: risks, benefits and alternatives were discussed Consent given by: patient Patient identity confirmed: provided demographic data Prepped and Draped in normal sterile fashion Wound explored  Laceration Location: left lateral hand  Laceration Length: 4cm  No Foreign Bodies seen or palpated  Anesthesia: local infiltration  Local anesthetic: lidocaine 1% without epinephrine  Anesthetic total: 3 ml  Irrigation method: syringe Amount of cleaning: standard  Skin closure: 4-0 prolene  Number of sutures: 3  Technique: simple interrupted  Patient tolerance: Patient tolerated the procedure well with no immediate  complications.   Medications Ordered in ED Medications  lidocaine (PF) (XYLOCAINE) 1 % injection 5 mL (5 mLs Intradermal Given by Other 02/01/18 0100)  Tdap (BOOSTRIX) injection 0.5 mL (0.5 mLs Intramuscular Given 02/01/18 0134)     Initial Impression / Assessment and Plan / ED Course  I have reviewed the triage vital signs and the nursing notes.  Pertinent labs & imaging results that were available during my care of the patient were reviewed by me and considered in my medical decision making (see chart for details).  47 year old female here with laceration of left lateral hand.  Was polishing a wine glass at work when it broke.  Has 4 cm superficial laceration to left lateral hand.  This is rather superficial.  There is no retained foreign body, deep tissue, or vessel involvement.  Full range of motion of the hand.  Screening x-ray negative.  Laceration repaired as above, patient tolerated well.  Tetanus updated.  Discussed home wound care.  Follow-up with PCP in 1 week for suture removal.  She understands to return here for any new or worsening symptoms.  Final Clinical Impressions(s) / ED Diagnoses   Final diagnoses:  Laceration of left hand without foreign body, initial encounter    ED Discharge Orders    None       Larene Pickett, PA-C 02/01/18 0140    Jola Schmidt, MD 02/01/18 [phone]

## 2018-02-01 NOTE — Discharge Instructions (Signed)
Keep sutures clean and dry. Follow-up with your primary care doctor in 1 week for suture removal. Return here for any new/acute changes.

## 2018-09-23 IMAGING — CR DG HAND COMPLETE 3+V*L*
3 series · 3 of 3 positions shown · non-contrast
Comparison: None.

CLINICAL DATA: Laceration from broken glass.

EXAM:
LEFT HAND - COMPLETE 3+ VIEW

[x hand pa left]
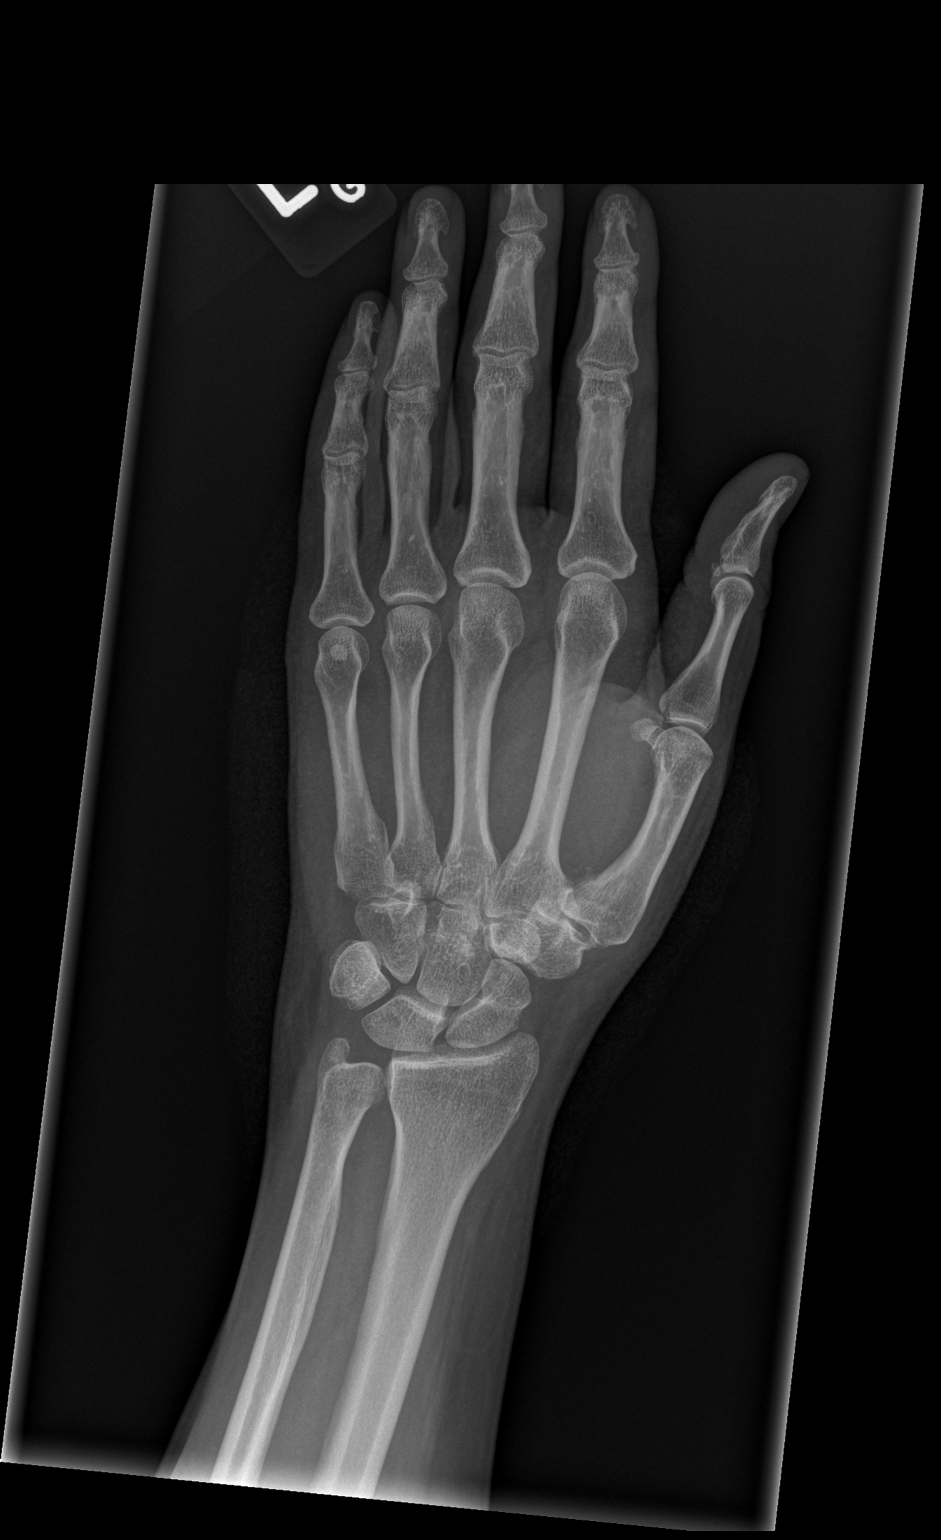

[x hand obl left]
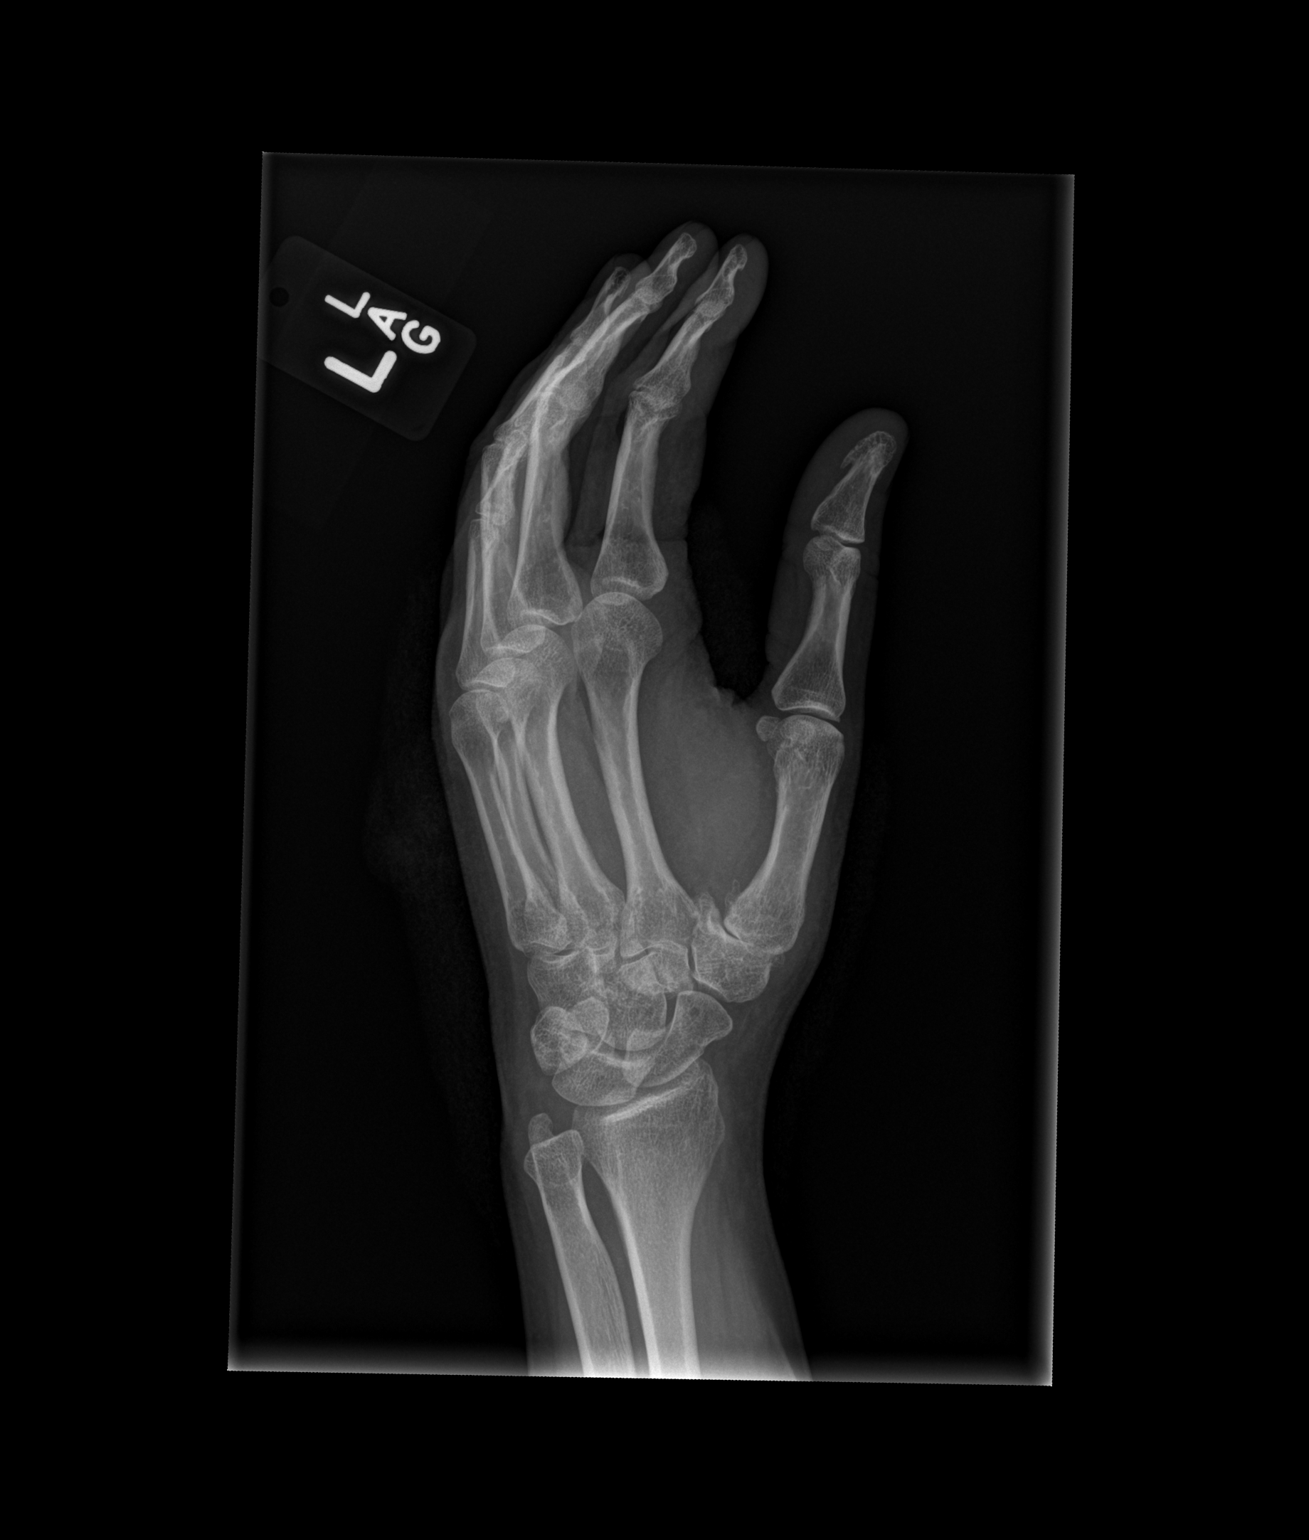

[x hand lat left]
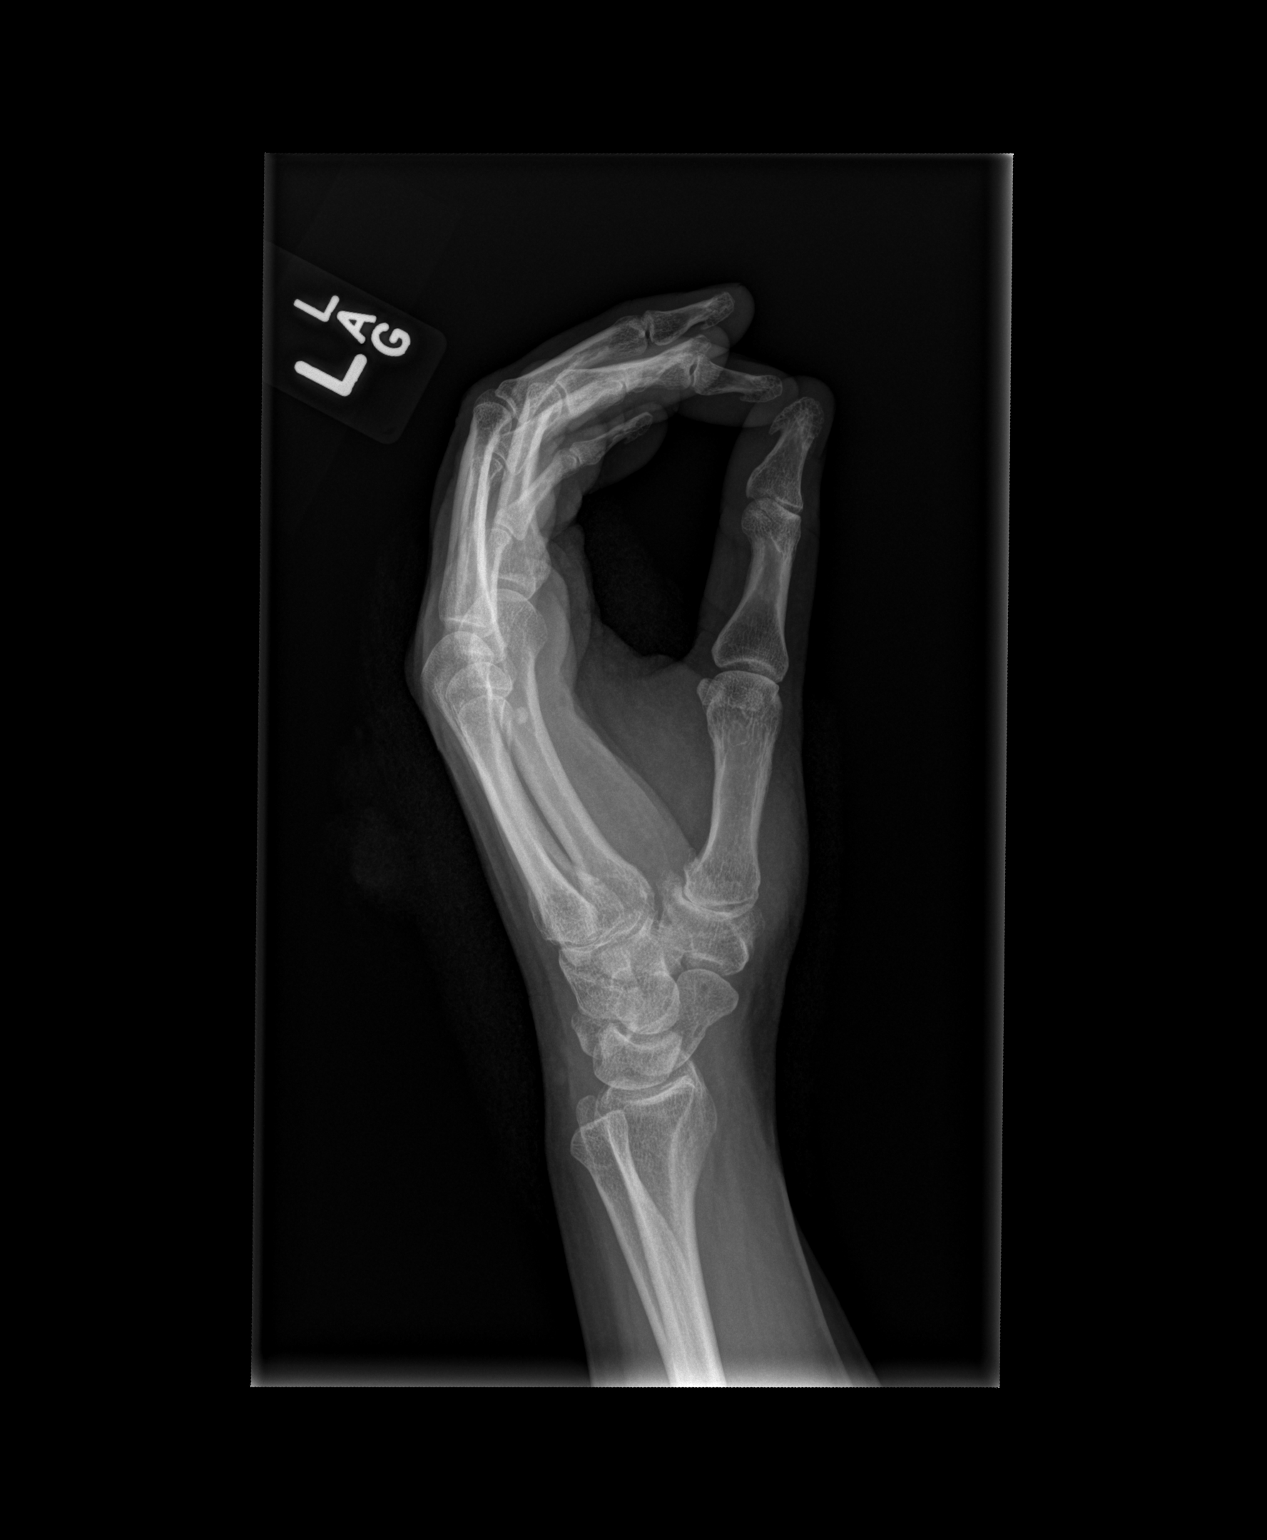

[3 of 3 positions shown; findings below may reference images not displayed]

FINDINGS: There is no evidence of fracture or dislocation. There is no
evidence of arthropathy or other focal bone abnormality. Soft
tissues are unremarkable. No radiopaque foreign body. A dressing
overlies the ulnar aspect of the hand. No tracking soft tissue air.
IMPRESSION: No radiopaque foreign body or acute osseous abnormality.

## 2019-12-03 ENCOUNTER — Other Ambulatory Visit: Payer: Self-pay | Admitting: Internal Medicine

## 2019-12-03 DIAGNOSIS — R4182 Altered mental status, unspecified: Secondary | ICD-10-CM

## 2019-12-03 DIAGNOSIS — R270 Ataxia, unspecified: Secondary | ICD-10-CM

## 2019-12-11 ENCOUNTER — Ambulatory Visit
Admission: RE | Admit: 2019-12-11 | Discharge: 2019-12-11 | Disposition: A | Discharge status: Home / Self Care | Payer: BC Managed Care – PPO | Source: Ambulatory Visit | Attending: Internal Medicine | Admitting: Internal Medicine

## 2019-12-11 ENCOUNTER — Other Ambulatory Visit: Payer: Self-pay

## 2019-12-11 DIAGNOSIS — R270 Ataxia, unspecified: Secondary | ICD-10-CM

## 2019-12-11 DIAGNOSIS — R4182 Altered mental status, unspecified: Secondary | ICD-10-CM

## 2022-11-10 ENCOUNTER — Ambulatory Visit (INDEPENDENT_AMBULATORY_CARE_PROVIDER_SITE_OTHER): Payer: Managed Care, Other (non HMO) | Admitting: Dermatology

## 2022-11-10 VITALS — BP 93/60 | HR 62 | Wt 200.0 lb

## 2022-11-10 DIAGNOSIS — Z7189 Other specified counseling: Secondary | ICD-10-CM | POA: Diagnosis not present

## 2022-11-10 DIAGNOSIS — Z1283 Encounter for screening for malignant neoplasm of skin: Secondary | ICD-10-CM

## 2022-11-10 DIAGNOSIS — L578 Other skin changes due to chronic exposure to nonionizing radiation: Secondary | ICD-10-CM

## 2022-11-10 DIAGNOSIS — L821 Other seborrheic keratosis: Secondary | ICD-10-CM

## 2022-11-10 DIAGNOSIS — L650 Telogen effluvium: Secondary | ICD-10-CM | POA: Diagnosis not present

## 2022-11-10 DIAGNOSIS — D229 Melanocytic nevi, unspecified: Secondary | ICD-10-CM

## 2022-11-10 DIAGNOSIS — L814 Other melanin hyperpigmentation: Secondary | ICD-10-CM

## 2022-11-10 DIAGNOSIS — Z808 Family history of malignant neoplasm of other organs or systems: Secondary | ICD-10-CM

## 2022-11-10 DIAGNOSIS — D225 Melanocytic nevi of trunk: Secondary | ICD-10-CM

## 2022-11-10 MED ORDER — MINOXIDIL 2.5 MG PO TABS: 2.5000 mg | ORAL_TABLET | Freq: Every day | ORAL | 2 refills | Status: DC

## 2022-11-10 NOTE — Patient Instructions (Addendum)
For hair thinning Start Minoxidil 2.'5mg'$  1/2 pill daily Start Biotin 2.'5mg'$  daily  Telogen Effluvium Counseling Telogen effluvium is a benign, self-limited condition causing increased hair shedding usually for several months. It does not progress to baldness, and the hair eventually grows back on its own. It can be triggered by recent illness, recent surgery, thyroid disease, low iron stores, vitamin D deficiency, fad diets or rapid weight loss, hormonal changes such as pregnancy or birth control pills, and some medication. Usually the hair loss starts 2-3 months after the illness or health change. Rarely, it can continue for longer than a year. Treatments options may include oral or topical Minoxidil; Red Light scalp treatments; Biotin 2.5 mg daily and other options.   Due to recent changes in healthcare laws, you may see results of your pathology and/or laboratory studies on MyChart before the doctors have had a chance to review them. We understand that in some cases there may be results that are confusing or concerning to you. Please understand that not all results are received at the same time and often the doctors may need to interpret multiple results in order to provide you with the best plan of care or course of treatment. Therefore, we ask that you please give [place] 2 business days to thoroughly review all your results before contacting the office for clarification. Should we see a critical lab result, you will be contacted sooner.   If You Need Anything After Your Visit  If you have any questions or concerns for your doctor, please call our main line at [phone] and press option 4 to reach your doctor's medical assistant. If no one answers, please leave a voicemail as directed and we will return your call as soon as possible. Messages left after 4 pm will be answered the following business day.   You may also send [place] a message via [place]. We typically respond to MyChart messages within 1-2  business days.  For prescription refills, please ask your pharmacy to contact our office. Our fax number is [phone].  If you have an urgent issue when the clinic is closed that cannot wait until the next business day, you can page your doctor at the number below.    Please note that while we do our best to be available for urgent issues outside of office hours, we are not available 24/7.   If you have an urgent issue and are unable to reach [place], you may choose to seek medical care at your doctor's office, retail clinic, urgent care center, or emergency room.  If you have a medical emergency, please immediately call 911 or go to the emergency department.  Pager Numbers  - Dr. Nehemiah Massed: [phone]  - Dr. Laurence Ferrari: [phone]  - Dr. Nicole Kindred: [phone]  In the event of inclement weather, please call our main line at [phone] for an update on the status of any delays or closures.  Dermatology Medication Tips: Please keep the boxes that topical medications come in in order to help keep track of the instructions about where and how to use these. Pharmacies typically print the medication instructions only on the boxes and not directly on the medication tubes.   If your medication is too expensive, please contact our office at [phone] option 4 or send [place] a message through [place].   We are unable to tell what your co-pay for medications will be in advance as this is different depending on your insurance coverage. However, we may be able to find  a substitute medication at lower cost or fill out paperwork to get insurance to cover a needed medication.   If a prior authorization is required to get your medication covered by your insurance company, please allow [place] 1-2 business days to complete this process.  Drug prices often vary depending on where the prescription is filled and some pharmacies may offer cheaper prices.  The website www.goodrx.com contains coupons for medications  through different pharmacies. The prices here do not account for what the cost may be with help from insurance (it may be cheaper with your insurance), but the website can give you the price if you did not use any insurance.  - You can print the associated coupon and take it with your prescription to the pharmacy.  - You may also stop by our office during regular business hours and pick up a GoodRx coupon card.  - If you need your prescription sent electronically to a different pharmacy, notify our office through [facility] or by phone at [phone] option 4.     Si Usted Necesita Algo Despus de Su Visita  Tambin puede enviarnos un mensaje a travs de Pharmacist, community. Por lo general respondemos a los mensajes de MyChart en el transcurso de 1 a 2 das hbiles.  Para renovar recetas, por favor pida a su farmacia que se ponga en contacto con nuestra oficina. Harland Dingwall de fax es [place] [phone].  Si tiene un asunto urgente cuando la clnica est cerrada y que no puede esperar hasta el siguiente da hbil, puede llamar/localizar a su doctor(a) al nmero que aparece a continuacin.   Por favor, tenga en cuenta que aunque hacemos todo lo posible para estar disponibles para asuntos urgentes fuera del horario de [place], no estamos disponibles las 24 horas del da, los 7 das de la [place].   Si tiene un problema urgente y no puede comunicarse con nosotros, puede optar por buscar atencin mdica  en el consultorio de su doctor(a), en una clnica privada, en un centro de atencin urgente o en una sala de emergencias.  Si tiene Engineering geologist, por favor llame inmediatamente al 911 o vaya a la sala de emergencias.  Nmeros de bper  - Dr. Nehemiah Massed: [phone]  - Dra. Moye: [phone]  - Dra. Nicole Kindred: [phone]  En caso de inclemencias del [place], por favor llame a Johnsie Kindred principal al [phone] para una actualizacin sobre el [place] de cualquier retraso o  cierre.  Consejos para la medicacin en dermatologa: Por favor, guarde las cajas en las que vienen los medicamentos de uso tpico para ayudarle a seguir las instrucciones sobre dnde y cmo usarlos. Las farmacias generalmente imprimen las instrucciones del medicamento slo en las cajas y no directamente en los tubos del [place].   Si su medicamento es muy caro, por favor, pngase en contacto con Zigmund Daniel llamando al [phone] y presione la opcin 4 o envenos un mensaje a travs de Pharmacist, community.   No podemos decirle cul ser su copago por los medicamentos por adelantado ya que esto es diferente dependiendo de la cobertura de su seguro. Sin embargo, es posible que podamos encontrar un medicamento sustituto a Electrical engineer un formulario para que el seguro cubra el medicamento que se considera necesario.   Si se requiere una autorizacin previa para que su compaa de seguros [place] su medicamento, por favor permtanos de 1 a 2 das hbiles para completar este proceso.  Los precios de los medicamentos varan con frecuencia dependiendo del  lugar de dnde se surte la receta y alguna farmacias pueden ofrecer precios ms baratos.  El sitio web www.goodrx.com tiene cupones para medicamentos de Airline pilot. Los precios aqu no tienen en cuenta lo que podra costar con la ayuda del seguro (puede ser ms barato con su seguro), pero el sitio web puede darle el precio si no utiliz Research scientist (physical sciences).  - Puede imprimir el cupn correspondiente y llevarlo con su receta a la farmacia.  - Tambin puede pasar por nuestra oficina durante el horario de atencin regular y Charity fundraiser una tarjeta de cupones de GoodRx.  - Si necesita que su receta se enve electrnicamente a una farmacia diferente, informe a nuestra oficina a travs de MyChart de [place] o por telfono llamando al [phone] y presione la opcin 4.

## 2022-11-10 NOTE — Progress Notes (Signed)
New Patient Visit  Subjective  Jessica [name] is a 51 y.o. female who presents for the following: Total body skin exam (Fhx of skin cancer, brother, father both BCC) and Hair thinning (Scalp, 1 yr, pt started Topamax 05/2021 for weight loss for about 40mthen d/c  but thinks hair thinning worsened, ). The patient presents for Total-Body Skin Exam (TBSE) for skin cancer screening and mole check.  The patient has spots, moles and lesions to be evaluated, some may be new or changing and the patient has concerns that these could be cancer.   The following portions of the chart were reviewed this encounter and updated as appropriate:   Tobacco  Allergies  Meds  Problems  Med Hx  Surg Hx  Fam Hx     Review of Systems:  No other skin or systemic complaints except as noted in HPI or Assessment and Plan.  Objective  Well appearing patient in no apparent distress; mood and affect are within normal limits.  A full examination was performed including scalp, head, eyes, ears, nose, lips, neck, chest, axillae, abdomen, back, buttocks, bilateral upper extremities, bilateral lower extremities, hands, feet, fingers, toes, fingernails, and toenails. All findings within normal limits unless otherwise noted below.  Scalp Hair thinning                Assessment & Plan   Lentigines - Scattered tan macules - Due to sun exposure - Benign-appearing, observe - Recommend daily broad spectrum sunscreen SPF 30+ to sun-exposed areas, reapply every 2 hours as needed. - Call for any changes - back  Seborrheic Keratoses - Stuck-on, waxy, tan-brown papules and/or plaques  - Benign-appearing - Discussed benign etiology and prognosis. - Observe - Call for any changes - trunk Melanocytic Nevi - Tan-brown and/or pink-flesh-colored symmetric macules and papules - Benign appearing on exam today - Observation - Call clinic for new or changing moles - Recommend daily use of broad spectrum  spf 30+ sunscreen to sun-exposed areas.  - trunk  Hemangiomas - Red papules - Discussed benign nature - Observe - Call for any changes - trunk  Actinic Damage - Chronic condition, secondary to cumulative UV/sun exposure - diffuse scaly erythematous macules with underlying dyspigmentation - Recommend daily broad spectrum sunscreen SPF 30+ to sun-exposed areas, reapply every 2 hours as needed.  - Staying in the shade or wearing long sleeves, sun glasses (UVA+UVB protection) and wide brim hats (4-inch brim around the entire circumference of the hat) are also recommended for sun protection.  - Call for new or changing lesions.  Skin cancer screening performed today.   Family history of skin cancer - what type(s): BCC - who affected: father, brother  Telogen effluvium Scalp  Telogen Effluvium Counseling Telogen effluvium is a benign, self-limited condition causing increased hair shedding usually for several months. It does not progress to baldness, and the hair eventually grows back on its own. It can be triggered by recent illness, recent surgery, thyroid disease, low iron stores, vitamin D deficiency, fad diets or rapid weight loss, hormonal changes such as pregnancy or birth control pills, and some medication. Usually the hair loss starts 2-3 months after the illness or health change. Rarely, it can continue for longer than a year. Treatments options may include oral or topical Minoxidil; Red Light scalp treatments; Biotin 2.5 mg daily and other options.  No hx of heart problems  Start Minoxidil 2.'5mg'$  1/2 po qd  Start Biotin 2.'5mg'$  qd  Doses of minoxidil for hair  loss are considered 'low dose'. This is because the doses used for hair loss are a lot lower than the doses which are used for conditions such as high blood pressure (hypertension). The doses used for hypertension are 10-'40mg'$  per day.  Side effects are uncommon at the low doses (up to 2.5 mg/day) used to treat hair loss.  Potential side effects, more commonly seen at higher doses, include: Increase in hair growth (hypertrichosis) elsewhere on face and body Temporary hair shedding upon starting medication which may last up to 4 weeks Ankle swelling, fluid retention, rapid weight gain more than 5 pounds Low blood pressure and feeling lightheaded or dizzy when standing up quickly Fast or irregular heartbeat Headaches   minoxidil (LONITEN) 2.5 MG tablet - Scalp Take 1 tablet (2.5 mg total) by mouth daily.   Return in about 4 months (around 03/11/2023) for Telogen Effluvium.  I, Othelia Pulling, RMA, am acting as scribe for Sarina Ser, MD . Documentation: I have reviewed the above documentation for accuracy and completeness, and I agree with the above.  Sarina Ser, MD

## 2022-11-21 ENCOUNTER — Encounter: Payer: Self-pay | Admitting: Dermatology

## 2023-03-16 ENCOUNTER — Ambulatory Visit: Payer: Managed Care, Other (non HMO) | Admitting: Dermatology

## 2023-03-16 ENCOUNTER — Encounter: Payer: Self-pay | Admitting: Dermatology

## 2023-03-16 VITALS — BP 94/68

## 2023-03-16 DIAGNOSIS — L609 Nail disorder, unspecified: Secondary | ICD-10-CM | POA: Diagnosis not present

## 2023-03-16 DIAGNOSIS — Z79899 Other long term (current) drug therapy: Secondary | ICD-10-CM

## 2023-03-16 DIAGNOSIS — L650 Telogen effluvium: Secondary | ICD-10-CM

## 2023-03-16 DIAGNOSIS — Z7189 Other specified counseling: Secondary | ICD-10-CM | POA: Diagnosis not present

## 2023-03-16 DIAGNOSIS — R21 Rash and other nonspecific skin eruption: Secondary | ICD-10-CM

## 2023-03-16 MED ORDER — MINOXIDIL 2.5 MG PO TABS: 2.5000 mg | ORAL_TABLET | Freq: Every day | ORAL | 1 refills | Status: AC

## 2023-03-16 NOTE — Progress Notes (Signed)
   Follow-Up Visit   Subjective  Zenita Syfert is a 52 y.o. female who presents for the following: Telogen Effluvium, scalp, 34m f/u, Minoxidil 2.5mg  1/2 po qd, no s/e of Minoxidil, not sure if any improvement, check toenail R 4th toenail, started getting white color around 1 yr ago  The following portions of the chart were reviewed this encounter and updated as appropriate: medications, allergies, medical history  Review of Systems:  No other skin or systemic complaints except as noted in HPI or Assessment and Plan.  Objective  Well appearing patient in no apparent distress; mood and affect are within normal limits.  A focused examination was performed of the following areas: Scalp, R 4th toenail  Relevant exam findings are noted in the Assessment and Plan.  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 Condition is symptomatic / bothersome to patient. Not to goal, but improving on treatment. Telogen effluvium is a benign, self-limited condition causing increased hair shedding usually for several months. It does not progress to baldness, and the hair eventually grows back on its own. It can be triggered by recent illness, recent surgery, thyroid disease, low iron stores, vitamin D deficiency, fad diets or rapid weight loss, hormonal changes such as pregnancy or birth control pills, and some medication. Usually the hair loss starts 2-3 months after the illness or health change. Rarely, it can continue for longer than a year. Treatments options may include oral or topical Minoxidil; Red Light scalp treatments; Biotin 2.5 mg daily and other options.  Treatment Plan: Improved as compared to photos from 11/10/22  Cont Minoxidil 2.5mg  1/2 po qd  Doses of minoxidil for hair loss are considered 'low dose'. This is because the  doses used for hair loss are much lower than the doses which are used for conditions such as high blood pressure (hypertension). The doses used for hypertension are 10-40mg  per day.  Side effects are uncommon at the low doses (up to 2.5 mg/day) used to treat hair loss. Potential side effects, more commonly seen at higher doses, include: Increase in hair growth (hypertrichosis) elsewhere on face and body Temporary hair shedding upon starting medication which may last up to 4 weeks Ankle swelling, fluid retention, rapid weight gain more than 5 pounds Low blood pressure and feeling lightheaded or dizzy when standing up quickly Fast or irregular heartbeat Headaches    NAIL PROBLEM Fungus vs Psoriasis vs Trauma vs other R 4th toenail Exam: nail dystrophy  Treatment Plan: Discussed Molecular study, will do today  Nail clippings collected for lab today.  ECZEMA vs OTHER Exam: pt describes dry patches that come and go on trunk Treatment Plan: Discussed can try otc HC cream  Return in about 6 months (around 09/15/2023) for Telogen Effluvium.  I, Othelia Pulling, RMA, am acting as scribe for Sarina Ser, MD .   Documentation: I have reviewed the above documentation for accuracy and completeness, and I agree with the above.  Sarina Ser, MD

## 2023-03-16 NOTE — Patient Instructions (Signed)
Due to recent changes in healthcare laws, you may see results of your pathology and/or laboratory studies on MyChart before the doctors have had a chance to review them. We understand that in some cases there may be results that are confusing or concerning to you. Please understand that not all results are received at the same time and often the doctors may need to interpret multiple results in order to provide you with the best plan of care or course of treatment. Therefore, we ask that you please give us 2 business days to thoroughly review all your results before contacting the office for clarification. Should we see a critical lab result, you will be contacted sooner.   If You Need Anything After Your Visit  If you have any questions or concerns for your doctor, please call our main line at [phone] and press option 4 to reach your doctor's medical assistant. If no one answers, please leave a voicemail as directed and we will return your call as soon as possible. Messages left after 4 pm will be answered the following business day.   You may also send us a message via MyChart. We typically respond to MyChart messages within 1-2 business days.  For prescription refills, please ask your pharmacy to contact our office. Our fax number is [phone].  If you have an urgent issue when the clinic is closed that cannot wait until the next business day, you can page your doctor at the number below.    Please note that while we do our best to be available for urgent issues outside of office hours, we are not available 24/7.   If you have an urgent issue and are unable to reach us, you may choose to seek medical care at your doctor's office, retail clinic, urgent care center, or emergency room.  If you have a medical emergency, please immediately call 911 or go to the emergency department.  Pager Numbers  - Dr. Kowalski: [phone]  - Dr. Moye: [phone]  - Dr. Stewart:  [phone]  In the event of inclement weather, please call our main line at [phone] for an update on the status of any delays or closures.  Dermatology Medication Tips: Please keep the boxes that topical medications come in in order to help keep track of the instructions about where and how to use these. Pharmacies typically print the medication instructions only on the boxes and not directly on the medication tubes.   If your medication is too expensive, please contact our office at [phone] option 4 or send us a message through MyChart.   We are unable to tell what your co-pay for medications will be in advance as this is different depending on your insurance coverage. However, we may be able to find a substitute medication at lower cost or fill out paperwork to get insurance to cover a needed medication.   If a prior authorization is required to get your medication covered by your insurance company, please allow us 1-2 business days to complete this process.  Drug prices often vary depending on where the prescription is filled and some pharmacies may offer cheaper prices.  The website www.goodrx.com contains coupons for medications through different pharmacies. The prices here do not account for what the cost may be with help from insurance (it may be cheaper with your insurance), but the website can give you the price if you did not use any insurance.  - You can print the associated coupon and take it with   your prescription to the pharmacy.  - You may also stop by our office during regular business hours and pick up a GoodRx coupon card.  - If you need your prescription sent electronically to a different pharmacy, notify our office through [place] MyChart or by phone at [phone] option 4.     Si Usted Necesita Algo Despus de Su Visita  Tambin puede enviarnos un mensaje a travs de MyChart. Por lo general respondemos a los mensajes de MyChart en el transcurso de 1 a 2  das hbiles.  Para renovar recetas, por favor pida a su farmacia que se ponga en contacto con nuestra oficina. Nuestro nmero de fax es el [phone].  Si tiene un asunto urgente cuando la clnica est cerrada y que no puede esperar hasta el siguiente da hbil, puede llamar/localizar a su doctor(a) al nmero que aparece a continuacin.   Por favor, tenga en cuenta que aunque hacemos todo lo posible para estar disponibles para asuntos urgentes fuera del horario de oficina, no estamos disponibles las 24 horas del da, los 7 das de la semana.   Si tiene un problema urgente y no puede comunicarse con nosotros, puede optar por buscar atencin mdica  en el consultorio de su doctor(a), en una clnica privada, en un centro de atencin urgente o en una sala de emergencias.  Si tiene una emergencia mdica, por favor llame inmediatamente al 911 o vaya a la sala de emergencias.  Nmeros de bper  - Dr. Kowalski: [phone]  - Dra. Moye: [phone]  - Dra. Stewart: [phone]  En caso de inclemencias del tiempo, por favor llame a nuestra lnea principal al [phone] para una actualizacin sobre el estado de cualquier retraso o cierre.  Consejos para la medicacin en dermatologa: Por favor, guarde las cajas en las que vienen los medicamentos de uso tpico para ayudarle a seguir las instrucciones sobre dnde y cmo usarlos. Las farmacias generalmente imprimen las instrucciones del medicamento slo en las cajas y no directamente en los tubos del medicamento.   Si su medicamento es muy caro, por favor, pngase en contacto con nuestra oficina llamando al [phone] y presione la opcin 4 o envenos un mensaje a travs de MyChart.   No podemos decirle cul ser su copago por los medicamentos por adelantado ya que esto es diferente dependiendo de la cobertura de su seguro. Sin embargo, es posible que podamos encontrar un medicamento sustituto a menor costo o llenar un formulario para que el  seguro cubra el medicamento que se considera necesario.   Si se requiere una autorizacin previa para que su compaa de seguros cubra su medicamento, por favor permtanos de 1 a 2 das hbiles para completar este proceso.  Los precios de los medicamentos varan con frecuencia dependiendo del lugar de dnde se surte la receta y alguna farmacias pueden ofrecer precios ms baratos.  El sitio web www.goodrx.com tiene cupones para medicamentos de diferentes farmacias. Los precios aqu no tienen en cuenta lo que podra costar con la ayuda del seguro (puede ser ms barato con su seguro), pero el sitio web puede darle el precio si no utiliz ningn seguro.  - Puede imprimir el cupn correspondiente y llevarlo con su receta a la farmacia.  - Tambin puede pasar por nuestra oficina durante el horario de atencin regular y recoger una tarjeta de cupones de GoodRx.  - Si necesita que su receta se enve electrnicamente a una farmacia diferente, informe a nuestra oficina a travs de MyChart de [place]   o por telfono llamando al [phone] y presione la opcin 4.  

## 2023-05-09 LAB — COLOGUARD: COLOGUARD: NEGATIVE

## 2023-05-09 LAB — EXTERNAL GENERIC LAB PROCEDURE: COLOGUARD: NEGATIVE

## 2023-09-13 ENCOUNTER — Other Ambulatory Visit: Payer: Self-pay | Admitting: Internal Medicine

## 2023-09-13 DIAGNOSIS — R1032 Left lower quadrant pain: Secondary | ICD-10-CM

## 2023-09-14 ENCOUNTER — Ambulatory Visit: Payer: Managed Care, Other (non HMO) | Admitting: Dermatology

## 2023-09-22 ENCOUNTER — Inpatient Hospital Stay: Payer: Managed Care, Other (non HMO)

## 2023-09-22 ENCOUNTER — Ambulatory Visit
Admission: RE | Admit: 2023-09-22 | Discharge: 2023-09-22 | Disposition: A | Discharge status: Home / Self Care | Payer: Managed Care, Other (non HMO) | Source: Ambulatory Visit | Attending: Internal Medicine | Admitting: Internal Medicine

## 2023-09-22 ENCOUNTER — Encounter: Payer: Self-pay | Admitting: Oncology

## 2023-09-22 ENCOUNTER — Inpatient Hospital Stay: Payer: Managed Care, Other (non HMO) | Attending: Oncology | Admitting: Oncology

## 2023-09-22 VITALS — BP 102/75 | HR 68 | Temp 97.6°F | Resp 18 | Wt 198.8 lb

## 2023-09-22 DIAGNOSIS — Z1502 Genetic susceptibility to malignant neoplasm of ovary: Secondary | ICD-10-CM | POA: Insufficient documentation

## 2023-09-22 DIAGNOSIS — R1032 Left lower quadrant pain: Secondary | ICD-10-CM

## 2023-09-22 DIAGNOSIS — Z87891 Personal history of nicotine dependence: Secondary | ICD-10-CM | POA: Diagnosis not present

## 2023-09-22 DIAGNOSIS — Z8041 Family history of malignant neoplasm of ovary: Secondary | ICD-10-CM | POA: Insufficient documentation

## 2023-09-22 DIAGNOSIS — Z1509 Genetic susceptibility to other malignant neoplasm: Secondary | ICD-10-CM | POA: Diagnosis not present

## 2023-09-22 DIAGNOSIS — Z1501 Genetic susceptibility to malignant neoplasm of breast: Secondary | ICD-10-CM | POA: Insufficient documentation

## 2023-09-22 DIAGNOSIS — Z803 Family history of malignant neoplasm of breast: Secondary | ICD-10-CM | POA: Insufficient documentation

## 2023-09-22 DIAGNOSIS — Z1371 Encounter for nonprocreative screening for genetic disease carrier status: Secondary | ICD-10-CM

## 2023-09-22 NOTE — Progress Notes (Signed)
Hematology/Oncology Consult Note [phone] [phone]     REFERRING PROVIDER: Marguarite Arbour, MD    CHIEF COMPLAINTS/PURPOSE OF CONSULTATION:  BRCA2 mutation  ASSESSMENT & PLAN:   BRCA2 gene mutation positive Recommend patient to send BRCA2 mutation report to our office. Genetic results were reviewed and discussed with patient  BRCA2 mutation is associated with increased risk for several cancers    Management Recommendations:   Breast Cancer Screening: Breast awarenessa starting at age 98 years. Clinical breast exam, every 6-12 months Annual mammogram and breast MRIe screening with and without contrast  Discussed about option of risk reduction bilateral mastectomy.  Limited data on tamoxifen or aromatase inhibitors (AIs) for risk reduction    Ovarian/FallopianTube / Peritoneal / Uterine Cancers : Recommend Surgical risk reduction with bilateral salpingo-oophorectomy CA-125 and images are recommended for preoperative planning  Limited data suggest that there may be a slightly increased risk of serous uterine cancer. The clinical significance of these findings is unclear. Consideration of hysterectomy  Continued risk of peritoneal ??r?in?ma after risk-reducing bilateral ???h?r??t?my    Pancreatic Cancer Screening: BR?A1/2 carriers with a first- or second-degree relative from the same side of the family as the pathogenic or likely pathologic variant with exocrine pancreatic ??n??r consider pancreatic ?an??r screening beginning at age 61.    Melanoma and non-melanomatous skin cancers: There are no specific guidelines but I recommend minimize UV exposure and generally recommend annual full-body skin exams   Recommend first degree relatives to be screened.  Patient is undecided and would like to further consider risk reduction options. She knows to call our office if she would like to be referred to surgery. She will also call if she would like me to  order screening mammogram and breast MRI.    Orders Placed This Encounter  Procedures   CA 125    Standing Status:   Future    Number of Occurrences:   1    Standing Expiration Date:   09/21/2024   Follow up PRN All questions were answered. The patient knows to call the clinic with any problems, questions or concerns.  Rickard Patience, MD, PhD [facility] Oncology 09/22/2023    HISTORY OF PRESENTING ILLNESS:  Jessica [name] 52 y.o. female presents to establish care for BRCA2 mutation. Patient reports being tested recently for BRCA2 mutation and she was tested positive.  Test result is not available to me currently. Her GYN physician ordered.  She has also had a breast examination performed by gynecologist.   Patient has a family history positive for ovarian cancer, prostate cancer and breast cancer. Patient is postmenopausal, LMP was 2019. Patient presents to establish care for BRCA 2 mutation.  She reports that she has discussed her results with genetic counselor. She has annual mammogram done.  Most recent mammogram was September 2024, at outside facility.  Results are not available to me.  MEDICAL HISTORY:  Past Medical History:  Diagnosis Date   Anxiety    Bipolar 1 disorder (HCC)     SURGICAL HISTORY: History reviewed. No pertinent surgical history.  SOCIAL HISTORY: Social History   Socioeconomic History   Marital status: Single    Spouse name: Not on file   Number of children: Not on file   Years of education: Not on file   Highest education level: Not on file  Occupational History   Not on file  Tobacco Use   Smoking status: Former    Current packs/day: 0.25    Average packs/day:  0.3 packs/day for 9.8 years (2.4 ttl pk-yrs)    Types: Cigarettes    Start date: 2015    Quit date: 1989   Smokeless tobacco: Never  Vaping Use   Vaping status: Never Used  Substance and Sexual Activity   Alcohol use: Not Currently   Drug use: Yes    Types:  Marijuana, Benzodiazepines   Sexual activity: Not on file  Other Topics Concern   Not on file  Social History Narrative   Not on file   Social Determinants of Health   Financial Resource Strain: Not on file  Food Insecurity: Not on file  Transportation Needs: Not on file  Physical Activity: Not on file  Stress: Not on file  Social Connections: Not on file  Intimate Partner Violence: Not on file    FAMILY HISTORY: Family History  Problem Relation Age of Onset   High Cholesterol Mother    Hypotension Mother    Dementia Mother    Clotting disorder Father    Skin cancer Father    Prostate cancer Father    Skin cancer Brother    Breast cancer Maternal Aunt    Alcohol abuse Paternal Uncle    Depression Paternal Uncle    Alcohol abuse Paternal Grandmother    Ovarian cancer Paternal Grandmother    Alcohol abuse Paternal Grandfather     ALLERGIES:  has No Known Allergies.  MEDICATIONS:  Current Outpatient Medications  Medication Sig Dispense Refill   minoxidil (LONITEN) 2.5 MG tablet Take 1 tablet (2.5 mg total) by mouth daily. 90 tablet 1   propranolol (INDERAL) 10 MG tablet Take 10 mg by mouth.     sertraline (ZOLOFT) 25 MG tablet Take 1 tablet (25 mg total) by mouth daily. 30 tablet 0   No current facility-administered medications for this visit.    Review of Systems  Constitutional:  Negative for appetite change, chills, fatigue and fever.  HENT:   Negative for hearing loss and voice change.   Eyes:  Negative for eye problems.  Respiratory:  Negative for chest tightness and cough.   Cardiovascular:  Negative for chest pain.  Gastrointestinal:  Negative for abdominal distention, abdominal pain and blood in stool.  Endocrine: Negative for hot flashes.  Genitourinary:  Negative for difficulty urinating and frequency.   Musculoskeletal:  Negative for arthralgias.  Skin:  Negative for itching and rash.  Neurological:  Negative for extremity weakness.  Hematological:   Negative for adenopathy.  Psychiatric/Behavioral:  Negative for confusion.      PHYSICAL EXAMINATION: ECOG PERFORMANCE STATUS: 0 - Asymptomatic  Vitals:   09/22/23 1514  BP: 102/75  Pulse: 68  Resp: 18  Temp: 97.6 F (36.4 C)   Filed Weights   09/22/23 1514  Weight: 198 lb 12.8 oz (90.2 kg)    Physical Exam Constitutional:      General: She is not in acute distress.    Appearance: She is not diaphoretic.  HENT:     Head: Normocephalic and atraumatic.     Nose: Nose normal.     Mouth/Throat:     Pharynx: No oropharyngeal exudate.  Eyes:     General: No scleral icterus.    Pupils: Pupils are equal, round, and reactive to light.  Cardiovascular:     Rate and Rhythm: Normal rate and regular rhythm.     Heart sounds: No murmur heard. Pulmonary:     Effort: Pulmonary effort is normal. No respiratory distress.     Breath sounds: No rales.  Chest:     Chest wall: No tenderness.  Abdominal:     General: There is no distension.     Palpations: Abdomen is soft.     Tenderness: There is no abdominal tenderness.  Musculoskeletal:        General: Normal range of motion.     Cervical back: Normal range of motion and neck supple.  Skin:    General: Skin is warm and dry.     Findings: No erythema.  Neurological:     Mental Status: She is alert and oriented to person, place, and time.     Cranial Nerves: No cranial nerve deficit.     Motor: No abnormal muscle tone.     Coordination: Coordination normal.  Psychiatric:        Mood and Affect: Affect normal.     LABORATORY DATA:  I have reviewed the data as listed    Latest Ref Rng & Units 01/13/2015    8:11 AM 01/12/2015   11:45 PM 01/07/2015    5:13 PM  CBC  WBC 4.0 - 10.5 K/uL  8.1  8.8   Hemoglobin 12.0 - 15.0 g/dL 45.4  09.8  11.9   Hematocrit 36.0 - 46.0 % 36.0  33.6  40.0   Platelets 150 - 400 K/uL  215  338       Latest Ref Rng & Units 01/13/2015    8:11 AM 01/12/2015   11:45 PM 01/07/2015    5:13 PM  CMP   Glucose 70 - 99 mg/dL 75  84  147   BUN 6 - 23 mg/dL 3  7  6    Creatinine 0.50 - 1.10 mg/dL 8.29  5.62  1.30   Sodium 135 - 145 mmol/L 148  141  135   Potassium 3.5 - 5.1 mmol/L 4.3  3.8  4.1   Chloride 96 - 112 mmol/L 113  108  98   CO2 19 - 32 mmol/L  26  24   Calcium 8.4 - 10.5 mg/dL  8.1  8.5   Total Protein 6.0 - 8.3 g/dL  5.9  6.8   Total Bilirubin 0.3 - 1.2 mg/dL  0.3  0.3   Alkaline Phos 39 - 117 U/L  43  61   AST 0 - 37 U/L  12  55   ALT 0 - 35 U/L  20  64      RADIOGRAPHIC STUDIES: I have personally reviewed the radiological images as listed and agreed with the findings in the report. No results found.

## 2023-09-22 NOTE — Assessment & Plan Note (Addendum)
Recommend patient to send BRCA2 mutation report to our office. Genetic results were reviewed and discussed with patient  BRCA2 mutation is associated with increased risk for several cancers    Management Recommendations:   Breast Cancer Screening: Breast awarenessa starting at age 51 years. Clinical breast exam, every 6-12 months Annual mammogram and breast MRIe screening with and without contrast  Discussed about option of risk reduction bilateral mastectomy.  Limited data on tamoxifen or aromatase inhibitors (AIs) for risk reduction    Ovarian/FallopianTube / Peritoneal / Uterine Cancers : Recommend Surgical risk reduction with bilateral salpingo-oophorectomy CA-125 and images are recommended for preoperative planning  Limited data suggest that there may be a slightly increased risk of serous uterine cancer. The clinical significance of these findings is unclear. Consideration of hysterectomy  Continued risk of peritoneal ??r?in?ma after risk-reducing bilateral ???h?r??t?my    Pancreatic Cancer Screening: BR?A1/2 carriers with a first- or second-degree relative from the same side of the family as the pathogenic or likely pathologic variant with exocrine pancreatic ??n??r consider pancreatic ?an??r screening beginning at age 13.    Melanoma and non-melanomatous skin cancers: There are no specific guidelines but I recommend minimize UV exposure and generally recommend annual full-body skin exams   Recommend first degree relatives to be screened.  Patient is undecided and would like to further consider risk reduction options. She knows to call our office if she would like to be referred to surgery. She will also call if she would like me to order screening mammogram and breast MRI.

## 2023-09-23 LAB — CA 125: Cancer Antigen (CA) 125: 10.9 U/mL (ref 0.0–38.1)

## 2024-06-02 ENCOUNTER — Other Ambulatory Visit: Payer: Self-pay | Admitting: Dermatology

## 2024-08-28 ENCOUNTER — Other Ambulatory Visit: Payer: Self-pay | Admitting: Dermatology
